# Patient Record
Sex: Female | Born: 1955 | Race: Black or African American | Hispanic: No | Marital: Married | Smoking: Never smoker
Health system: Southern US, Community
[De-identification: ages and names within clinical notes are randomized; demographics above are authoritative.]

## PROBLEM LIST (undated history)

## (undated) DIAGNOSIS — I10 Essential (primary) hypertension: Secondary | ICD-10-CM

## (undated) DIAGNOSIS — E7800 Pure hypercholesterolemia, unspecified: Secondary | ICD-10-CM

## (undated) HISTORY — PX: MULTIPLE TOOTH EXTRACTIONS: SHX2053

---
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
## 1996-07-22 ENCOUNTER — Encounter (INDEPENDENT_AMBULATORY_CARE_PROVIDER_SITE_OTHER): Payer: Self-pay | Admitting: *Deleted

## 1996-07-22 LAB — CONVERTED CEMR LAB

## 1999-09-10 ENCOUNTER — Emergency Department (HOSPITAL_COMMUNITY): Admission: EM | Admit: 1999-09-10 | Discharge: 1999-09-10 | Payer: Self-pay | Admitting: *Deleted

## 1999-09-10 ENCOUNTER — Encounter: Payer: Self-pay | Admitting: *Deleted

## 1999-12-26 ENCOUNTER — Emergency Department (HOSPITAL_COMMUNITY): Admission: EM | Admit: 1999-12-26 | Discharge: 1999-12-27 | Payer: Self-pay | Admitting: Emergency Medicine

## 2000-04-26 ENCOUNTER — Emergency Department (HOSPITAL_COMMUNITY): Admission: EM | Admit: 2000-04-26 | Discharge: 2000-04-26 | Payer: Self-pay | Admitting: Emergency Medicine

## 2000-04-26 ENCOUNTER — Encounter: Payer: Self-pay | Admitting: Emergency Medicine

## 2000-12-03 ENCOUNTER — Emergency Department (HOSPITAL_COMMUNITY): Admission: EM | Admit: 2000-12-03 | Discharge: 2000-12-03 | Payer: Self-pay | Admitting: Emergency Medicine

## 2000-12-05 ENCOUNTER — Emergency Department (HOSPITAL_COMMUNITY): Admission: EM | Admit: 2000-12-05 | Discharge: 2000-12-05 | Payer: Self-pay | Admitting: Emergency Medicine

## 2000-12-12 ENCOUNTER — Encounter: Admission: RE | Admit: 2000-12-12 | Discharge: 2000-12-12 | Payer: Self-pay | Admitting: Family Medicine

## 2001-04-02 ENCOUNTER — Encounter: Admission: RE | Admit: 2001-04-02 | Discharge: 2001-04-02 | Payer: Self-pay | Admitting: Family Medicine

## 2001-04-13 ENCOUNTER — Encounter: Admission: RE | Admit: 2001-04-13 | Discharge: 2001-04-13 | Payer: Self-pay | Admitting: Family Medicine

## 2001-04-18 ENCOUNTER — Ambulatory Visit (HOSPITAL_COMMUNITY): Admission: RE | Admit: 2001-04-18 | Discharge: 2001-04-18 | Payer: Self-pay | Admitting: *Deleted

## 2001-05-04 ENCOUNTER — Encounter: Admission: RE | Admit: 2001-05-04 | Discharge: 2001-05-04 | Payer: Self-pay | Admitting: Family Medicine

## 2001-06-09 ENCOUNTER — Encounter: Admission: RE | Admit: 2001-06-09 | Discharge: 2001-06-09 | Payer: Self-pay | Admitting: Family Medicine

## 2001-06-17 ENCOUNTER — Encounter: Admission: RE | Admit: 2001-06-17 | Discharge: 2001-06-17 | Payer: Self-pay | Admitting: Family Medicine

## 2002-02-07 ENCOUNTER — Emergency Department (HOSPITAL_COMMUNITY): Admission: EM | Admit: 2002-02-07 | Discharge: 2002-02-07 | Payer: Self-pay | Admitting: *Deleted

## 2004-01-04 ENCOUNTER — Emergency Department (HOSPITAL_COMMUNITY): Admission: EM | Admit: 2004-01-04 | Discharge: 2004-01-05 | Payer: Self-pay | Admitting: Emergency Medicine

## 2005-08-06 ENCOUNTER — Ambulatory Visit: Payer: Self-pay | Admitting: Family Medicine

## 2005-08-30 ENCOUNTER — Ambulatory Visit: Payer: Self-pay | Admitting: Family Medicine

## 2005-08-30 LAB — CONVERTED CEMR LAB
HDL: 58 mg/dL
HDL: 58 mg/dL
HDL: 58 mg/dL
HDL: 58 mg/dL
LDL Cholesterol: 122 mg/dL
LDL Cholesterol: 122 mg/dL
LDL Cholesterol: 122 mg/dL
LDL Cholesterol: 122 mg/dL
Pap Smear: NORMAL
Pap Smear: NORMAL
Pap Smear: NORMAL
Pap Smear: NORMAL
Pap Smear: NORMAL

## 2006-09-19 ENCOUNTER — Encounter (INDEPENDENT_AMBULATORY_CARE_PROVIDER_SITE_OTHER): Payer: Self-pay | Admitting: *Deleted

## 2007-03-16 ENCOUNTER — Encounter: Payer: Self-pay | Admitting: *Deleted

## 2007-10-21 ENCOUNTER — Encounter: Payer: Self-pay | Admitting: *Deleted

## 2008-02-05 ENCOUNTER — Telehealth: Payer: Self-pay | Admitting: *Deleted

## 2008-02-24 ENCOUNTER — Ambulatory Visit: Payer: Self-pay | Admitting: Family Medicine

## 2008-02-24 ENCOUNTER — Encounter: Payer: Self-pay | Admitting: Family Medicine

## 2008-02-24 DIAGNOSIS — E785 Hyperlipidemia, unspecified: Secondary | ICD-10-CM | POA: Insufficient documentation

## 2008-02-24 DIAGNOSIS — E669 Obesity, unspecified: Secondary | ICD-10-CM | POA: Insufficient documentation

## 2008-02-24 DIAGNOSIS — J309 Allergic rhinitis, unspecified: Secondary | ICD-10-CM | POA: Insufficient documentation

## 2008-02-24 LAB — CONVERTED CEMR LAB
Cholesterol, target level: 200 mg/dL
Cholesterol: 186 mg/dL (ref 0–200)
HDL goal, serum: 40 mg/dL
HDL: 63 mg/dL (ref >39)
LDL Cholesterol: 98 mg/dL (ref 0–99)
LDL Goal: 160 mg/dL
Total CHOL/HDL Ratio: 3
Triglycerides: 126 mg/dL (ref <150)
VLDL: 25 mg/dL (ref 0–40)
Whiff Test: POSITIVE

## 2008-02-25 ENCOUNTER — Telehealth: Payer: Self-pay | Admitting: Family Medicine

## 2008-03-04 ENCOUNTER — Encounter: Payer: Self-pay | Admitting: Family Medicine

## 2008-03-22 ENCOUNTER — Encounter: Payer: Self-pay | Admitting: Family Medicine

## 2008-08-08 ENCOUNTER — Ambulatory Visit: Payer: Self-pay | Admitting: Family Medicine

## 2008-09-08 ENCOUNTER — Telehealth: Payer: Self-pay | Admitting: Sports Medicine

## 2008-09-12 ENCOUNTER — Ambulatory Visit: Payer: Self-pay | Admitting: Family Medicine

## 2009-07-26 ENCOUNTER — Ambulatory Visit: Payer: Self-pay | Admitting: Family Medicine

## 2009-07-27 ENCOUNTER — Encounter: Payer: Self-pay | Admitting: Family Medicine

## 2009-07-27 ENCOUNTER — Ambulatory Visit: Payer: Self-pay | Admitting: Family Medicine

## 2009-07-27 LAB — CONVERTED CEMR LAB
BUN: 11 mg/dL (ref 6–23)
Calcium: 8.6 mg/dL (ref 8.4–10.5)
Creatinine, Ser: 0.63 mg/dL (ref 0.40–1.20)
Glucose, Bld: 91 mg/dL (ref 70–99)

## 2009-07-31 ENCOUNTER — Encounter: Payer: Self-pay | Admitting: Family Medicine

## 2009-09-20 ENCOUNTER — Ambulatory Visit: Payer: Self-pay | Admitting: Family Medicine

## 2009-09-20 DIAGNOSIS — I10 Essential (primary) hypertension: Secondary | ICD-10-CM

## 2009-09-20 DIAGNOSIS — J069 Acute upper respiratory infection, unspecified: Secondary | ICD-10-CM | POA: Insufficient documentation

## 2009-09-20 HISTORY — DX: Essential (primary) hypertension: I10

## 2009-10-26 ENCOUNTER — Ambulatory Visit: Payer: Self-pay

## 2010-04-23 ENCOUNTER — Encounter: Payer: Self-pay | Admitting: Family Medicine

## 2010-04-23 DIAGNOSIS — J45909 Unspecified asthma, uncomplicated: Secondary | ICD-10-CM | POA: Insufficient documentation

## 2010-08-23 NOTE — Miscellaneous (Signed)
  Clinical Lists Changes  Problems: Changed problem from ASTHMA (ICD-493.90) to ASTHMA, INTERMITTENT (ICD-493.90) 

## 2010-08-23 NOTE — Assessment & Plan Note (Signed)
Summary: headache,df   Vital Signs:  Patient profile:   55 year old female Weight:      204 pounds Temp:     97.5 degrees F oral Pulse rate:   77 / minute BP sitting:   127 / 82  (left arm) Cuff size:   regular  Vitals Entered By: Tessie Fass, [name] CC: headache x 1week Is Patient Diabetic? No Pain Assessment Patient in pain? yes     Location: head Intensity: 6   Primary Care Provider:  Milinda Antis MD  CC:  headache x 1week.  History of Present Illness: Alexandra White comes in for several issues: 1) No menses since October: Had been having menses fairly regularly but has not had one since October.  Though thinks she is experience some premenstrual symptoms now.  Wonders if she is starting menopause. 2) Headache - very mild, steady pressure on left side.  Feels like headache she will usually get before menses but has lasted a week.  No associated weakenss, numbness, visual changes.  Self describes as very mild.  Took one aspirin but it didn't resolve.   3) Numbness in feet - had tingling in both feet for about an hour a few days ago.  Sister recently had a stroke and this worried her.  There is a lot of diabetes in her family and she is worried she may have diabetes.   Habits & Providers  Alcohol-Tobacco-Diet     Tobacco Status: never  Allergies: 1)  ! Penicillin  Physical Exam  General:  overweight, alert, NAD, cooperative vitals reviewed.  Eyes:  pupils equal, pupils round, pupils reactive to light, pupils react to accomodation, corneas and lenses clear, and no injection.   Ears:  External ear exam shows no significant lesions or deformities.  Otoscopic examination reveals clear canals, tympanic membranes are intact bilaterally without bulging, retraction, inflammation or discharge. Hearing is grossly normal bilaterally. Mouth:  Oral mucosa and oropharynx without lesions or exudates.  Teeth in good repair. Lungs:  Normal respiratory effort, chest expands symmetrically.  Lungs are clear to auscultation, no crackles or wheezes. Heart:  Normal rate and regular rhythm. S1 and S2 normal without gallop, murmur, click, rub or other extra sounds. Pulses:  2+ radial and dp pulses Neurologic:  alert & oriented X3, cranial nerves II-XII intact, strength normal in all extremities, sensation intact to light touch, and gait normal.   Skin:  Intact without suspicious lesions or rashes Cervical Nodes:  No lymphadenopathy noted Psych:  Oriented X3, memory intact for recent and remote, normally interactive, good eye contact, not anxious appearing, and not depressed appearing.     Impression & Recommendations:  Problem # 1:  AMENORRHEA (ICD-626.0) Assessment New  Patietn is the age to begin menopause.  Denies possibility of pregnancy.  Offered preg test but patient declines.  Advised she coudl take one at home if she changes her mind.  However, feel likely due to initiation of menopause.  Discussed menses often space and become irregular so she may still have occassional menses.   Orders: [facility]- Est  Level 4 ([address])  Problem # 2:  HEADACHE (ICD-784.0) Assessment: New  Very mild.  No red flags.  No indication for imaging at  this time.  REcommended ibuprofen and tylenol.   Orders: [facility]- Est  Level 4 ([address])  Problem # 3:  NUMBNESS (ICD-782.0) Assessment: New  Unclear etiology.  Brief..  No recurrence.  Given family history and overweight, will have her return for fasting BMET  to check sugar and electrolytes.   Orders: [facility]- Est  Level 4 ([address]214)Future Orders: Basic Met-[facility] ([address]81-19147) ... 08/18/2009  Complete Medication List: 1)  Claritin-d 24 Hour 10-240 Mg Xr24h-tab (Loratadine-pseudoephedrine)

## 2010-08-23 NOTE — Assessment & Plan Note (Signed)
Summary: F/U VISIT/[facility]    This blood pressure is much improved off of the OTC medications for her virus. No other changes needed at this time, I am in agreement with the exercise and fluid intake. Milinda Antis MD  October 27, 2009 5:28 PM    Nurse Visit Presented for BP check, stated that she got lighthead after excercising yesterday, states she did not eat or have alot of fluid intake before or during excercise class, stated that once she was home she did drink fluids per a freinds advice and felt better.  Advised about fluid intake before during and after exercising, explained effects of dehydration to the body, voiced understanding.  Told her that I would forward note to Dr about BP and if she had any other advise that we would call her.  She has been off all the OTC medication except Claritin for 1 to 1 1/2 weeks To PCP.Marland KitchenGladstone Pih  October 26, 2009 11:44 AM   Vital Signs:  Patient profile:   55 year old female Pulse rate:   81 / minute BP sitting:   128 / 85  (left arm) Cuff size:   regular  Vitals Entered By: Gladstone Pih (October 26, 2009 11:35 AM)  Allergies: 1)  ! Penicillin  Orders Added: 1)  Est Level 1- [facility] [40981]

## 2010-08-23 NOTE — Assessment & Plan Note (Signed)
Summary: hoarness/congestion,df   Vital Signs:  Patient profile:   55 year old female Weight:      98.6 pounds Temp:     98.6 degrees F oral Pulse rate:   79 / minute BP sitting:   141 / 96  (left arm) Cuff size:   large  Vitals Entered By: Tessie Fass [name] (September 20, 2009 1:37 PM) CC: cough and congestion x 1 weeks Is Patient Diabetic? No Pain Assessment Patient in pain? no        Primary Care Provider:  Milinda Antis MD  CC:  cough and congestion x 1 weeks.  History of Present Illness:    Pt states she has had  cold symptoms x 1 week. subjective fever initially, with sinus drainage and pressure.+ productive cough yellow sputum but improving. More concerned with continued cough and hoarse voice. + sick contacts with people at work. No jaw pain Take OTC nyquil, cough meds, robitussin   ROS- denies chest pain, SOB, muscle aches, diarrhea, dysuria, occasional chills   Habits & Providers  Alcohol-Tobacco-Diet     Tobacco Status: never  Current Medications (verified): 1)  Claritin-D 24 Hour 10-240 Mg  Xr24h-Tab (Loratadine-Pseudoephedrine) 2)  Tessalon Perles 100 Mg Caps (Benzonatate) .Marland Kitchen.. 1 By Mouth Three Times A Day As Needed Cough 3)  Flonase 50 Mcg/act Susp (Fluticasone Propionate) .Marland Kitchen.. 1 Spray Each Nostril Twice  A Day X 5 Days  Allergies (verified): 1)  ! Penicillin  Past History:  Past Medical History: Last updated: 02/24/2008 Childhood Asthma Allergic rhinitis  Review of Systems       Per HPI  Physical Exam  General:  overweight, alert, NAD, cooperative vitals reviewed.  Voice hoarse Eyes:  No corneal or conjunctival inflammation noted. EOMI. Perrla.  Ears:  External ear exam shows no significant lesions or deformities.  Otoscopic examination reveals clear canals, tympanic membranes are intact bilaterally without bulging, retraction, inflammation or discharge. Hearing is grossly normal bilaterally. Nose:  clear rhinorrhea, mild TTP over  maxiallary/ethmoid sinus  Mouth:  MMM, no pharygeal erythema, no exudates Neck:  shotty LAD No wheeze in upper airway/neck Lungs:  CTAB Heart:  RRR   Impression & Recommendations:  Problem # 1:  VIRAL URI (ICD-465.9) Assessment New  Symptoms more consistent with viral URI, no likley bacterial sinusitis as too early. Treat symptomatically. Note history of allergic rhinitis, trial of flonase as well.  Her updated medication list for this problem includes:    Claritin-d 24 Hour 10-240 Mg Xr24h-tab (Loratadine-pseudoephedrine)    Tessalon Perles 100 Mg Caps (Benzonatate) .Marland Kitchen... 1 by mouth three times a day as needed cough  Orders: [facility]- Est  Level 4 (11914)  Problem # 2:  ALLERGIC RHINITIS (ICD-477.9) Assessment: Deteriorated  Her updated medication list for this problem includes:    Flonase 50 Mcg/act Susp (Fluticasone propionate) .Marland Kitchen... 1 spray each nostril twice  a day x 5 days  Orders: [facility]- Est  Level 4 ([address]214)  Problem # 3:  ELEVATED BP READING WITHOUT DX HYPERTENSION (ICD-796.2) Assessment: New  Recheck after virus, pt has used many OTC meds that may be driving BP up.   Orders: [facility]- Est  Level 4 (99214)  Complete Medication List: 1)  Claritin-d 24 Hour 10-240 Mg Xr24h-tab (Loratadine-pseudoephedrine) 2)  Tessalon Perles 100 Mg Caps (Benzonatate) .Marland Kitchen.. 1 by mouth three times a day as needed cough 3)  Flonase 50 Mcg/act Susp (Fluticasone propionate) .Marland Kitchen.. 1 spray each nostril twice  a day x 5 days  Patient Instructions:  1)  You have a virus for your  cold use the cough drops three times a day and the throat spray (Chloroseptic spray) 2)  Try hot tea with honey 3)  You can use the nasal spray for your allergy like symptoms 4)  continue the Claritin daily 5)  Follow up for repeat blood pressure with the nurse in 3 weeks Prescriptions: FLONASE 50 MCG/ACT SUSP (FLUTICASONE PROPIONATE) 1 spray each nostril twice  a day x 5 days  #1 x 0   Entered and Authorized by:   Milinda Antis MD   Signed by:   Milinda Antis MD on 09/20/2009   Method used:   Electronically to        [facility] [address] #[id number]* (retail)       [address]       [place], [place]  [address]       Ph: [phone]       Fax: [phone]   RxID:   [id number] TESSALON PERLES 100 MG CAPS (BENZONATATE) 1 by mouth three times a day as needed cough  #30 x 0   Entered and Authorized by:   Milinda Antis MD   Signed by:   Milinda Antis MD on 09/20/2009   Method used:   Electronically to        [facility] #[id number]* (retail)       [address]       [place], [place]  [address]       Ph: [phone]       Fax: [phone]   RxID:   [id number]

## 2010-08-23 NOTE — Letter (Signed)
Summary: Generic Letter  [facility]  [address]   [place], [place] [address]   Phone: [phone]  Fax: [phone]    07/31/2009  Alexandra White [address] [place], [place]  [address]  Dear [name],  The results of your recent labwork are below.  Everything is 100% normal.  There is no evidence of diabetes at this time.  If you have any questions, please contact our office.    Basic Metabolic Panel      Your value:       Normal range:   Order Note: FASTING   Sodium                    140 mEq/L                   135-145   Potassium                 4.2 mEq/L                   3.5-5.3   Chloride                  108 mEq/L                   96-112   CO2                       22 mEq/L                    19-32   Glucose                   91 mg/dL                    96-29   BUN                       11 mg/dL                    5-28   Creatinine                0.63 mg/dL                  0.40-1.20   Calcium                   8.6 mg/dL                   4.1-32.4        Sincerely,   Ardeen Garland  MD  Appended Document: Generic Letter mailed.

## 2010-08-23 NOTE — Progress Notes (Signed)
 Summary: Triage   Phone Note Call from Patient Call back at Home Phone [phone]   Summary of Call: pt is c/o ankle pain, is wanting xrays done, was seen in January for that. Initial call taken by: Benton Manson,  September 08, 2008 4:54 PM  Follow-up for Phone Call        c/o constant swelling & pain. has affected what shoes she can wear. will send message to pcp to see if she can have an order to go get an xray Follow-up by: Ginnie Mau RN,  September 08, 2008 4:59 PM  Additional Follow-up for Phone Call Additional follow up Details #1::        Not having seen the patient and with hx of trauma, I would only feel comfortable imaging ankle if [place] Criteria are met, she would need to be seen (maybe SDA/work-in) for this to be assessed, she can see me at my next available spot if Dr. Bari not available or set up SDA. Additional Follow-up by: Debby Petties MD,  September 09, 2008 11:22 AM      Appended Document: Triage pt wants appt early monday. pcp not available. to see S. Saxon

## 2010-10-05 ENCOUNTER — Ambulatory Visit: Payer: Self-pay | Admitting: Family Medicine

## 2010-10-08 ENCOUNTER — Ambulatory Visit: Payer: Self-pay | Admitting: Family Medicine

## 2010-12-20 ENCOUNTER — Encounter: Payer: Self-pay | Admitting: Family Medicine

## 2010-12-20 ENCOUNTER — Ambulatory Visit (INDEPENDENT_AMBULATORY_CARE_PROVIDER_SITE_OTHER): Payer: Self-pay | Admitting: Family Medicine

## 2010-12-20 DIAGNOSIS — S335XXA Sprain of ligaments of lumbar spine, initial encounter: Secondary | ICD-10-CM

## 2010-12-20 DIAGNOSIS — S39012A Strain of muscle, fascia and tendon of lower back, initial encounter: Secondary | ICD-10-CM | POA: Insufficient documentation

## 2010-12-20 MED ORDER — CYCLOBENZAPRINE HCL 10 MG PO TABS: 10.0000 mg | ORAL_TABLET | Freq: Every day | ORAL | Status: AC

## 2010-12-20 MED ORDER — IBUPROFEN 600 MG PO TABS: 600.0000 mg | ORAL_TABLET | Freq: Four times a day (QID) | ORAL | Status: AC | PRN

## 2010-12-20 NOTE — Progress Notes (Signed)
  Subjective:    Patient ID: Alexandra White, female    DOB: January 01, 1956, 55 y.o.   MRN: [id number]  HPI  Back Pain: Patient presents for presents evaluation of low back problems.  Symptoms have been present for 4 days and include stiffness in right side more than left.. Initial inciting event: she and her husband just finished moving.. Symptoms are worst: nighttime, she has not been able to sleep. Alleviating factors identifiable by patient are none.  She tried Advil, Midol, and hot tub but did not help. Exacerbating factors identifiable by patient are laying it at night makes it worse, walking makes it better.. Treatments so far initiated by patient: Advil, Midol, hot tub, heating pads. Previous lower back problems: none. Previous workup: none. Previous treatments: none. She denies Fever, chills, past cancer, currently immunosuppressed, urinary/bowel incontinence, numbness in legs, weakness in legs.   She denies saddle distribution numbness or pain, no pain radiating to her legs, no dysuria, frequency, hematuria, fever, chills, nausea, vomiting.   Review of Systems Negative except hpi    Objective:   Physical Exam  Constitutional: She appears well-developed and well-nourished. No distress.  Musculoskeletal:       Lumbar back: She exhibits pain and spasm. She exhibits normal range of motion, no tenderness, no bony tenderness, no swelling, no edema, no deformity, no laceration and normal pulse.       Sitting and lying straight leg raise: normal Hips stable Pulses: normal Reflexes: +2 Extension: mild tenderness Flexion: intact Gait:normal  Skin: Skin is warm. No rash noted. No erythema.          Assessment & Plan:

## 2010-12-20 NOTE — Patient Instructions (Signed)
Back Exercises Back exercises help treat and prevent back injuries. The goal of back exercises is to increase the strength of your abdominal and back muscles and the flexibility of your back. These exercises should be started when you no longer have back pain. Back exercises include: 1. Pelvic Tilt - Lie on your back with your knees bent. Tilt your pelvis until the lower part of your back is against the floor. Hold this position 5-10 sec and repeat 5-10 times.  2. Knee to Chest - Pull first one knee up against your chest and hold for 20-30 seconds, repeat this with the other knee, and then both knees. This may be done with the other leg straight or bent, whichever feels better.  3. Sit-Ups or Curl-Ups - Bend your knees 90 degrees. Start with tilting your pelvis, and do a partial, slow sit-up, lifting your trunk only 30-45 degrees off the floor. Take at least 2-3 sec for each sit-up. Do not do sit-ups with your knees out straight. If partial sit-ups are difficult, simply do the above but with only tightening your abdominal muscles and holding it as directed.  4. Hip-Lift - Lie on your back with your knees flexed 90 degrees. Push down with your feet and shoulders as you raise your hips a couple inches off the floor; hold for 10 sec, repeat 5-10 times.  5. Back arches - Lie on your stomach, propping yourself up on bent elbows. Slowly press on your hands, causing an arch in your low back. Repeat 3-5 times. Any initial stiffness and discomfort should lessen with repetition over time.  6. Shoulder-Lifts - Lie face down with arms beside your body. Keep hips and torso pressed to floor as you slowly lift your head and shoulders off the floor.  Do not overdo your exercises, especially in the beginning. Exercises may cause you some mild back discomfort which lasts for a few minutes; however, if the pain is more severe, or lasts for more than 15 minutes, do not continue exercises until you see your caregiver.  Improvement with exercise therapy for back problems is slow.  See your caregivers for assistance with developing a proper back exercise program. Document Released: 08/15/2004 Document Re-Released: 10/04/2008 Exit[employer] Patient Information 2011 [employer]. 

## 2010-12-21 NOTE — Assessment & Plan Note (Signed)
Low back strain likely from moving from her house a few days ago.  No red flags on ROS. Will treat with NSAID, flexeril and handout on back strengthening exercises.

## 2011-05-07 ENCOUNTER — Ambulatory Visit: Payer: Self-pay | Admitting: Family Medicine

## 2012-08-05 ENCOUNTER — Encounter: Payer: Self-pay | Admitting: Family Medicine

## 2012-09-08 ENCOUNTER — Ambulatory Visit (INDEPENDENT_AMBULATORY_CARE_PROVIDER_SITE_OTHER): Payer: Self-pay | Admitting: Family Medicine

## 2012-09-08 ENCOUNTER — Ambulatory Visit: Payer: Self-pay

## 2012-09-08 VITALS — BP 155/94 | HR 76 | Temp 98.2°F | Ht 67.0 in | Wt 198.0 lb

## 2012-09-08 DIAGNOSIS — E669 Obesity, unspecified: Secondary | ICD-10-CM | POA: Insufficient documentation

## 2012-09-08 DIAGNOSIS — R42 Dizziness and giddiness: Secondary | ICD-10-CM | POA: Insufficient documentation

## 2012-09-08 DIAGNOSIS — R030 Elevated blood-pressure reading, without diagnosis of hypertension: Secondary | ICD-10-CM

## 2012-09-08 LAB — COMPREHENSIVE METABOLIC PANEL
ALT: 15 U/L (ref 0–35)
Alkaline Phosphatase: 72 U/L (ref 39–117)
Glucose, Bld: 97 mg/dL (ref 70–99)
Sodium: 141 mEq/L (ref 135–145)
Total Bilirubin: 0.5 mg/dL (ref 0.3–1.2)
Total Protein: 6.5 g/dL (ref 6.0–8.3)

## 2012-09-08 LAB — CBC
MCH: 27.9 pg (ref 26.0–34.0)
MCV: 83.9 fL (ref 78.0–100.0)
Platelets: 257 10*3/uL (ref 150–400)
RBC: 4.77 MIL/uL (ref 3.87–5.11)

## 2012-09-08 MED ORDER — MECLIZINE HCL 25 MG PO TABS: 25.0000 mg | ORAL_TABLET | Freq: Three times a day (TID) | ORAL | Status: DC | PRN

## 2012-09-08 NOTE — Assessment & Plan Note (Signed)
Unsure of cause of dizziness.  Will get baseline labwork.   Possibly due to inner ear issue, will give trial of meclizine. Her neurological exam is completely normal and no findings, will give her a trial of meclizine to see if this is helpful.  Instructed to follow up with PCP for repeat blood pressure and follow up of this.

## 2012-09-08 NOTE — Assessment & Plan Note (Signed)
BP elevated, will have her f/u with PCP for BP recheck and discuss whether she needs to be on medication.

## 2012-09-08 NOTE — Progress Notes (Signed)
  Subjective:    Patient ID: Alexandra White, female    DOB: [birth year], 57 y.o.   MRN: [id number]  HPI  1.  Dizziness:  C/o of dizziness x2 days.  States she began having dizziness after dinner yesterday.  She is concerned she may have high blood sugar because diabetes runs in her family.  Her dizziness has improved today to near resolution.  She finds if difficult to describe the dizziness as feeling if the room is spinning or light headedness. She has a history of borderline hypertension and has never been on any medication.  She does endorse polyuria without polydipsia.  She has had no difficulty with gait and denies any weakness, numbness or tingling.   Review of Systems Per HPI    Objective:   Physical Exam  Constitutional: She appears well-nourished. No distress.  HENT:  Head: Normocephalic and atraumatic.  Eyes: EOM are normal. Pupils are equal, round, and reactive to light.  Neck: Neck supple. No thyromegaly present.  Cardiovascular: Normal rate, regular rhythm and normal heart sounds.   Pulmonary/Chest: Effort normal and breath sounds normal. No respiratory distress. She has no wheezes.  Musculoskeletal: She exhibits no edema.  Neurological: She is alert. She has normal strength. No cranial nerve deficit or sensory deficit. She displays a negative Romberg sign. Coordination and gait normal.  Cerebellar testing normal.  Non focal neurological exam.           Assessment & Plan:

## 2012-09-08 NOTE — Patient Instructions (Addendum)
Thank you for coming in today, it was good to see you Your blood pressure is elevated today, please schedule a follow up as soon as you can to follow up with Dr. Louanne Belton I have sent in a medication that may be helpful for your dizziness, start it at night as it can make you drowsy i will let you know if your labwork is abnormal.

## 2012-09-24 ENCOUNTER — Telehealth: Payer: Self-pay | Admitting: Family Medicine

## 2012-09-24 NOTE — Telephone Encounter (Signed)
Patient informed labs normal.  Gaylene Brooks, RN

## 2012-09-24 NOTE — Telephone Encounter (Signed)
Patient calling to received labwork results. Please call [phone]

## 2012-09-27 ENCOUNTER — Encounter: Payer: Self-pay | Admitting: Family Medicine

## 2013-06-03 ENCOUNTER — Ambulatory Visit: Payer: Self-pay | Admitting: Family Medicine

## 2014-02-03 ENCOUNTER — Ambulatory Visit: Payer: Self-pay | Admitting: Family Medicine

## 2014-02-22 ENCOUNTER — Ambulatory Visit: Payer: Self-pay | Admitting: Family Medicine

## 2014-04-18 ENCOUNTER — Ambulatory Visit (INDEPENDENT_AMBULATORY_CARE_PROVIDER_SITE_OTHER): Payer: Managed Care, Other (non HMO) | Admitting: Family Medicine

## 2014-04-18 ENCOUNTER — Encounter: Payer: Self-pay | Admitting: Family Medicine

## 2014-04-18 VITALS — BP 152/91 | HR 81 | Temp 98.2°F | Ht 67.0 in | Wt 199.0 lb

## 2014-04-18 DIAGNOSIS — S76319A Strain of muscle, fascia and tendon of the posterior muscle group at thigh level, unspecified thigh, initial encounter: Secondary | ICD-10-CM | POA: Insufficient documentation

## 2014-04-18 DIAGNOSIS — IMO0002 Reserved for concepts with insufficient information to code with codable children: Secondary | ICD-10-CM

## 2014-04-18 DIAGNOSIS — S76311A Strain of muscle, fascia and tendon of the posterior muscle group at thigh level, right thigh, initial encounter: Secondary | ICD-10-CM

## 2014-04-18 MED ORDER — IBUPROFEN 800 MG PO TABS: 800.0000 mg | ORAL_TABLET | Freq: Three times a day (TID) | ORAL | Status: DC | PRN

## 2014-04-18 MED ORDER — CYCLOBENZAPRINE HCL 5 MG PO TABS: 5.0000 mg | ORAL_TABLET | Freq: Three times a day (TID) | ORAL | Status: DC | PRN

## 2014-04-18 NOTE — Progress Notes (Signed)
Alexandra White is a 58 y.o. female who presents today for R low back pain with occasional numbness into the lateral aspect of her foot.   This has been ongoing now for about 2 days.  She did move furniture around about 3 days ago now.  States she has R low back pain into the gluteal region with occasional paresthesias down the lateral aspect of her leg into the lateral foot.  Denies any weakness, bowel/bladder issues, fever, chills, night sweats, or weight loss.  She has had this previously that went away with time and muscle relaxers.  To this point, she has not tried anything to improve her pain and has not noticed anything that exacerbates this.    No past medical history on file.  History  Smoking status  . Passive Smoke Exposure - Never Smoker  Smokeless tobacco  . Not on file    No family history on file.  Current Outpatient Prescriptions on File Prior to Visit  Medication Sig Dispense Refill  . meclizine (ANTIVERT) 25 MG tablet Take 1 tablet (25 mg total) by mouth 3 (three) times daily as needed for dizziness.  30 tablet  0   No current facility-administered medications on file prior to visit.    ROS: Per HPI.  All other systems reviewed and are negative.   Physical Exam Filed Vitals:   04/18/14 1013  BP: 152/91  Pulse: 81  Temp: 98.2 F (36.8 C)    Physical Examination: General appearance - alert, well appearing, and in no distress Heart - normal rate, regular rhythm, normal S1, S2, no murmurs, rubs, clicks or gallops Musculoskeletal - R Hip Hip: ROM IR: 80 Deg, ER: 80 Deg, Flexion: 120 Deg, Extension: 100 Deg, Abduction: 45 Deg, Adduction: 45 Deg Strength IR: 5/5, ER: 5/5, Flexion: 5/5, Extension: 5/5, Abduction: 5/5, Adduction: 5/5 Pelvic alignment unremarkable to inspection and palpation. Standing hip rotation and gait without trendelenburg / unsteadiness. + tenderness over piriformis and greater trochanter. No SI joint tenderness and normal minimal SI  movement.  Back: No gross edema/erythema/deformity.  No TTP in the lumbar region.  Full ROM in flexion/extension.  MS 5/5 in all planes.  Negative SLR both seated and supine.  Neurovascularly intact B/L

## 2014-04-18 NOTE — Assessment & Plan Note (Addendum)
Hx and Physical C/w piriformis muscle strain as she is tender on the posterior Greater Tubercle, pain worse with cross over maneuver, and having occasional paresthesias (S1, sural nerve) when doing this.  No weakness or bowel/bladder, sensation changes, or red flags.  Will tx with ibuprofen 800 mg TID-QID, flexeril 5 mg TID PRN and QHS, and stretches.  F/U in 10-14 days if no improvement and can consider low back imaging, formal physical therapy, or injection into the area.

## 2014-04-18 NOTE — Patient Instructions (Signed)
Piriformis Syndrome with Rehab Piriformis syndrome is a condition the affects the nervous system in the area of the hip, and is characterized by pain and possibly a loss of feeling in the backside (posterior) thigh that may extend down the entire length of the leg. The symptoms are caused by an increase in pressure on the sciatic nerve by the piriformis muscle, which is on the back of the hip and is responsible for externally rotating the hip. The sciatic nerve and its branches connect to much of the leg. Normally the sciatic nerve runs between the piriformis muscle and other muscles. However, in certain individuals the nerve runs through the muscle, which causes an increase in pressure on the nerve and results in the symptoms of piriformis syndrome. SYMPTOMS   Pain, tingling, numbness, or burning in the back of the thigh that may also extend down the entire leg.  Occasionally, tenderness in the buttock.  Loss of function of the leg.  Pain that worsens when using the piriformis muscle (running, jumping, or stairs).  Pain that increases with prolonged sitting.  Pain that is lessened by lying flat on the back. CAUSES   Piriformis syndrome is the result of an increase in pressure placed on the sciatic nerve. Oftentimes, piriformis syndrome is an overuse injury.  Stress placed on the nerve from a sudden increase in the intensity, frequency, or duration of training.  Compensation of other extremity injuries. RISK INCREASES WITH:  Sports that involve the piriformis muscle (running, walking, or jumping).  You are born with (congenital) a defect in which the sciatic nerve passes through the muscle. PREVENTION  Warm up and stretch properly before activity.  Allow for adequate recovery between workouts.  Maintain physical fitness:  Strength, flexibility, and endurance.  Cardiovascular fitness. PROGNOSIS  If treated properly, the symptoms of piriformis syndrome usually resolve in 2 to 6  weeks. RELATED COMPLICATIONS   Persistent and possibly permanent pain and numbness in the lower extremity.  Weakness of the extremity that may progress to disability and inability to compete. TREATMENT  The most effective treatment for piriformis syndrome is rest from any activities that aggravate the symptoms. Ice and pain medication may help reduce pain and inflammation. The use of strengthening and stretching exercises may help reduce pain with activity. These exercises may be performed at home or with a therapist. A referral to a therapist may be given for further evaluation and treatment, such as ultrasound. Corticosteroid injections may be given to reduce inflammation that is causing pressure to be placed on the sciatic nerve. If nonsurgical (conservative) treatment is unsuccessful, then surgery may be recommended.  MEDICATION   If pain medication is necessary, then nonsteroidal anti-inflammatory medications, such as aspirin and ibuprofen, or other minor pain relievers, such as acetaminophen, are often recommended.  Do not take pain medication for 7 days before surgery.  Prescription pain relievers may be given if deemed necessary by your caregiver. Use only as directed and only as much as you need.  Corticosteroid injections may be given by your caregiver. These injections should be reserved for the most serious cases, because they may only be given a certain number of times. HEAT AND COLD:   Cold treatment (icing) relieves pain and reduces inflammation. Cold treatment should be applied for 10 to 15 minutes every 2 to 3 hours for inflammation and pain and immediately after any activity that aggravates your symptoms. Use ice packs or massage the area with a piece of ice (ice massage).  Heat   treatment may be used prior to performing the stretching and strengthening activities prescribed by your caregiver, physical therapist, or athletic trainer. Use a heat pack or soak the injury in warm  water. SEEK IMMEDIATE MEDICAL CARE IF:  Treatment seems to offer no benefit, or the condition worsens.  Any medications produce adverse side effects. EXERCISES RANGE OF MOTION (ROM) AND STRETCHING EXERCISES - Piriformis Syndrome These exercises may help you when beginning to rehabilitate your injury. Your symptoms may resolve with or without further involvement from your physician, physical therapist, or athletic trainer. While completing these exercises, remember:   Restoring tissue flexibility helps normal motion to return to the joints. This allows healthier, less painful movement and activity.  An effective stretch should be held for at least 30 seconds.  A stretch should never be painful. You should only feel a gentle lengthening or release in the stretched tissue. STRETCH - Hip Rotators  Lie on your back on a firm surface. Grasp your right / left knee with your right / left hand and your ankle with your opposite hand.  Keeping your hips and shoulders firmly planted, gently pull your right / left knee and rotate your lower leg toward your opposite shoulder until you feel a stretch in your buttocks.  Hold this stretch for __________ seconds. Repeat this stretch __________ times. Complete this stretch __________ times per day. STRETCH - Iliotibial Band  On the floor or bed, lie on your side so your right / left leg is on top. Bend your knee and grab your ankle.  Slowly bring your knee back so that your thigh is in line with your trunk. Keep your heel at your buttocks and gently arch your back so your head, shoulders, and hips line up.  Slowly lower your leg so that your knee approaches the floor/bed until you feel a gentle stretch on the outside of your right / left thigh. If you do not feel a stretch and your knee will not fall farther, place the heel of your opposite foot on top of your knee and pull your thigh down farther.  Hold this stretch for __________ seconds. Repeat  __________ times. Complete __________ times per day. STRENGTHENING EXERCISES - Piriformis Syndrome  These are some of the caregiver again or until your symptoms are resolved. Remember:   Strong muscles with good endurance tolerate stress better.  Do the exercises as initially prescribed by your caregiver. Progress slowly with each exercise, gradually increasing the number of repetitions and weight used under their guidance. STRENGTH - Hip Abductors, Straight Leg Raises Be aware of your form throughout the entire exercise so that you exercise the correct muscles. Sloppy form means that you are not strengthening the correct muscles.  Lie on your side so that your head, shoulders, knee, and hip line up. You may bend your lower knee to help maintain your balance. Your right / left leg should be on top.  Roll your hips slightly forward, so that your hips are stacked directly over each other and your right / left knee is facing forward.  Lift your top leg up 4-6 inches, leading with your heel. Be sure that your foot does not drift forward or that your knee does not roll toward the ceiling.  Hold this position for __________ seconds. You should feel the muscles in your outer hip lifting (you may not notice this until your leg begins to tire).  Slowly lower your leg to the starting position. Allow the muscles to fully  relax before beginning the next repetition. Repeat __________ times. Complete this exercise __________ times per day.  STRENGTH - Hip Abductors, Quadruped  On a firm, lightly padded surface, position yourself on your hands and knees. Your hands should be directly below your shoulders and your knees should be directly below your hips.  Keeping your right / left knee bent, lift your leg out to the side. Keep your legs level and in line with your shoulders.  Position yourself on your hands and knees.  Hold for __________ seconds.  Keeping your trunk steady and your hips level, slowly  lower your leg to the starting position. Repeat __________ times. Complete this exercise __________ times per day.  STRENGTH - Hip Abductors, Standing  Tie one end of a rubber exercise band/tubing to a secure surface (table, pole) and tie a loop at the other end.  Place the loop around your right / left ankle. Keeping your ankle with the band directly opposite of the secured end, step away until there is tension in the tube/band.  Hold onto a chair as needed for balance.  Keeping your back upright, your shoulders over your hips, and your toes pointing forward, lift your right / left leg out to your side. Be sure to lift your leg with your hip muscles. Do not "throw" your leg or tip your body to lift your leg.  Slowly and with control, return to the starting position. Repeat exercise __________ times. Complete this exercise __________ times per day.  Document Released: 07/08/2005 Document Revised: 11/22/2013 Document Reviewed: 10/20/2008 ExitCare Patient Information 2015 [employer]. This information is not intended to replace advice given to you by your health care provider. Make sure you discuss any questions you have with your health care provider.  

## 2014-08-22 ENCOUNTER — Ambulatory Visit: Payer: Managed Care, Other (non HMO) | Admitting: Family Medicine

## 2014-09-09 ENCOUNTER — Ambulatory Visit: Payer: Managed Care, Other (non HMO) | Admitting: Family Medicine

## 2014-12-16 ENCOUNTER — Ambulatory Visit: Payer: Managed Care, Other (non HMO) | Admitting: Family Medicine

## 2015-01-03 ENCOUNTER — Encounter: Payer: Managed Care, Other (non HMO) | Admitting: Family Medicine

## 2015-02-20 ENCOUNTER — Ambulatory Visit: Payer: Managed Care, Other (non HMO) | Admitting: Family Medicine

## 2015-03-01 ENCOUNTER — Encounter: Payer: Managed Care, Other (non HMO) | Admitting: Family Medicine

## 2015-03-16 ENCOUNTER — Ambulatory Visit (INDEPENDENT_AMBULATORY_CARE_PROVIDER_SITE_OTHER): Payer: Managed Care, Other (non HMO) | Admitting: Family Medicine

## 2015-03-16 VITALS — BP 170/120 | HR 80 | Temp 99.4°F | Wt 199.9 lb

## 2015-03-16 DIAGNOSIS — R030 Elevated blood-pressure reading, without diagnosis of hypertension: Secondary | ICD-10-CM | POA: Diagnosis not present

## 2015-03-16 DIAGNOSIS — I10 Essential (primary) hypertension: Secondary | ICD-10-CM | POA: Diagnosis not present

## 2015-03-16 DIAGNOSIS — IMO0001 Reserved for inherently not codable concepts without codable children: Secondary | ICD-10-CM

## 2015-03-16 LAB — POCT UA - MICROSCOPIC ONLY

## 2015-03-16 LAB — POCT URINALYSIS DIPSTICK
BILIRUBIN UA: NEGATIVE
GLUCOSE UA: NEGATIVE
Ketones, UA: NEGATIVE
NITRITE UA: NEGATIVE
Protein, UA: NEGATIVE
Spec Grav, UA: 1.02
Urobilinogen, UA: 0.2
pH, UA: 6.5

## 2015-03-16 MED ORDER — AMLODIPINE BESYLATE 2.5 MG PO TABS: 2.5000 mg | ORAL_TABLET | Freq: Every day | ORAL | Status: DC

## 2015-03-16 NOTE — Patient Instructions (Addendum)
Thank you for coming in,   I will call you or send a letter wit the results of your labs.   Make an appointment in two weeks if you are still feeling bad.   I will send in a medication called amlodipine to help with your blood pressure.   Please bring all of your medications with you to each visit.    Please feel free to call with any questions or concerns at any time, at [phone]. --Dr. Raeford Razor

## 2015-03-16 NOTE — Progress Notes (Signed)
   Subjective:    Alexandra White - 59 y.o. female MRN [id number]  Date of birth: [birth year]  HPI  Hansika Leaming is here for elevated BP.   BP: no hx of HTN but has had elevated BP.  She has been having HA off and on.  She had her bp taken at work and it was elevated. 138/102 and 148/106 She has a fhx of HTN and Dm2.  She doesn't use tobacco, EtOH or illicit drugs  She has been using Claritin D on Sunday and Monday .  Health Maintenance:  Health Maintenance Due  Topic Date Due  . Hepatitis C Screening  [birth year]  . HIV Screening  04/25/1972  . TETANUS/TDAP  04/25/1976  . MAMMOGRAM  04/26/2007  . COLONOSCOPY  04/26/2007  . INFLUENZA VACCINE  02/20/2015    -  reports that she has been passively smoking.  She does not have any smokeless tobacco history on file. - Review of Systems: Per HPI.  - Past Medical History: Patient Active Problem List   Diagnosis Date Noted  . Strain of piriformis muscle 04/18/2014  . Obesity 09/08/2012  . ASTHMA, INTERMITTENT 04/23/2010  . ELEVATED BP READING WITHOUT DX HYPERTENSION 09/20/2009  . HYPERLIPIDEMIA, BORDERLINE 02/24/2008   - Medications: reviewed and updated Current Outpatient Prescriptions  Medication Sig Dispense Refill  . cyclobenzaprine (FLEXERIL) 5 MG tablet Take 1 tablet (5 mg total) by mouth 3 (three) times daily as needed for muscle spasms. 30 tablet 1  . ibuprofen (ADVIL,MOTRIN) 800 MG tablet Take 1 tablet (800 mg total) by mouth every 8 (eight) hours as needed. 30 tablet 0  . meclizine (ANTIVERT) 25 MG tablet Take 1 tablet (25 mg total) by mouth 3 (three) times daily as needed for dizziness. 30 tablet 0   No current facility-administered medications for this visit.     Review of Systems See HPI     Objective:   Physical Exam BP 170/120 mmHg  Pulse 80  Temp(Src) 99.4 F (37.4 C) (Oral)  Wt 199 lb 14.4 oz (90.674 kg)  LMP 09/19/2010 Gen: NAD, alert, cooperative with exam, well-appearing CV: RRR, good S1/S2, no  murmur, no edema, capillary refill brisk  Resp: CTABL, no wheezes, non-labored  Re-check: 142/84     Assessment & Plan:

## 2015-03-18 NOTE — Assessment & Plan Note (Addendum)
Would consider this a dx of HTN with a couple readings outside the office  - encourage diet and exercise (DASH) - start amlodipine  - BMP, Lipid panel, UA, and TSH

## 2015-03-21 ENCOUNTER — Encounter: Payer: Self-pay | Admitting: Family Medicine

## 2015-03-21 LAB — BASIC METABOLIC PANEL WITH GFR

## 2015-03-21 LAB — TSH

## 2015-03-24 ENCOUNTER — Telehealth: Payer: Self-pay

## 2015-03-24 ENCOUNTER — Telehealth: Payer: Self-pay | Admitting: Family Medicine

## 2015-03-24 ENCOUNTER — Encounter: Payer: Self-pay | Admitting: Family Medicine

## 2015-03-24 NOTE — Telephone Encounter (Signed)
Spoke with pt regarding previous note- Per Dr. Raeford Razor, ok to take tylenol with BP meds, but if HA continues to call for appointment.  Wallace Cullens, [name]

## 2015-03-24 NOTE — Telephone Encounter (Signed)
Pt called because she is taking a new BP medication but she is still having a little of the headache. She would like to speak to the doctor and discuss what is the next step and can she take some OTC medication for her headache. jw

## 2015-03-24 NOTE — Telephone Encounter (Signed)
Try to leave voicemail but mailbox is full.  If she calls back please have her speak with a nurse/[name] and inform her that her urine showed showed no protein which is reassuring. She can take Tylenol for her headache but I would avoid doing this for a long time. If the headache continues over the course of 3 weeks with no alleviation then she should be seen.   If any questions then please take the best time and phone number to call and I will try to call her back.   Rosemarie Ax, MD PGY-3, [facility] 03/24/2015, 10:20 AM

## 2015-03-29 ENCOUNTER — Telehealth: Payer: Self-pay | Admitting: Family Medicine

## 2015-03-29 NOTE — Telephone Encounter (Signed)
Pt calling and would like to receive her results from recent blood work from 03/16/15. Thank you, Fonda Kinder, ASA

## 2015-03-30 NOTE — Telephone Encounter (Signed)
Pt called back.. She went to [facility] on thurs last week and had her blood drawn This was [address] Please advise

## 2015-03-30 NOTE — Telephone Encounter (Signed)
Left VM for patient. If she calls back please have her speak with a nurse/[name] and inform that her labs samples were collected but the courier handled them incorrectly so we were unable to get any results. I thought she was informed of this earlier and up to her if she wants to re-draw the labs.   If any questions then please take the best time and phone number to call and I will try to call her back.   Rosemarie Ax, MD PGY-3, [facility] 03/30/2015, 1:13 PM

## 2015-03-31 ENCOUNTER — Telehealth: Payer: Self-pay | Admitting: Family Medicine

## 2015-03-31 NOTE — Telephone Encounter (Signed)
Unable to leave a voicemail for patient.  If she calls back please have her speak with a nurse/[name] and inform that she would need to have those labs sent to the office as I cannot see the labs that she had done at a [facility].   If any questions then please take the best time and phone number to call and I will try to call her back.   Rosemarie Ax, MD PGY-3, [facility] 03/31/2015, 10:08 AM

## 2015-04-06 NOTE — Telephone Encounter (Signed)
Pt calling once more. She states that the date that she went to [place] was on 03/23/2015 and would like to know if she should still come

## 2015-04-14 ENCOUNTER — Other Ambulatory Visit: Payer: Self-pay | Admitting: *Deleted

## 2015-04-14 MED ORDER — AMLODIPINE BESYLATE 2.5 MG PO TABS: 2.5000 mg | ORAL_TABLET | Freq: Every day | ORAL | Status: DC

## 2015-04-18 NOTE — Telephone Encounter (Signed)
Was this handled? Last message was 9/15 and I'm receiving this 9/27. Looking through her chart, I do not see any new results for any recent labs.

## 2015-04-19 NOTE — Telephone Encounter (Signed)
Confirmed with Herbie Baltimore in [employer], he said she was good that she does not need anymore labs they have already been completed. Katharina Caper, April D, [place]

## 2015-04-20 NOTE — Telephone Encounter (Signed)
Pt would like to know these results, she states she has not been contacted with them yet and would like to know if they are "good or bad". Please follow up with the patient, she states "no one ever calls me back". Please call the patient. Alexandra White, ASA

## 2015-04-20 NOTE — Telephone Encounter (Signed)
Please inform patient that her TSH and BMP were never resulted because of lab error. It seems like this was handled at some point but I am unfamiliar with the patient and need for lab work so I will wait for Dr. Raeford Razor to return before placing any orders for labs.

## 2015-04-25 NOTE — Telephone Encounter (Signed)
Will forward to MD to review results and check to with robert for most recent results. Jazmin Hartsell,[name]

## 2015-05-09 ENCOUNTER — Ambulatory Visit: Payer: Managed Care, Other (non HMO) | Admitting: Family Medicine

## 2015-05-19 ENCOUNTER — Other Ambulatory Visit: Payer: Self-pay | Admitting: *Deleted

## 2015-05-22 MED ORDER — AMLODIPINE BESYLATE 2.5 MG PO TABS: 2.5000 mg | ORAL_TABLET | Freq: Every day | ORAL | Status: DC

## 2015-05-30 ENCOUNTER — Encounter: Payer: Self-pay | Admitting: Family Medicine

## 2015-08-23 ENCOUNTER — Ambulatory Visit (INDEPENDENT_AMBULATORY_CARE_PROVIDER_SITE_OTHER): Payer: Managed Care, Other (non HMO) | Admitting: Family Medicine

## 2015-08-23 ENCOUNTER — Encounter: Payer: Self-pay | Admitting: Family Medicine

## 2015-08-23 VITALS — BP 150/99 | HR 73 | Temp 97.9°F | Wt 214.8 lb

## 2015-08-23 DIAGNOSIS — Z1159 Encounter for screening for other viral diseases: Secondary | ICD-10-CM

## 2015-08-23 DIAGNOSIS — R42 Dizziness and giddiness: Secondary | ICD-10-CM | POA: Diagnosis not present

## 2015-08-23 DIAGNOSIS — E669 Obesity, unspecified: Secondary | ICD-10-CM | POA: Diagnosis not present

## 2015-08-23 DIAGNOSIS — Z114 Encounter for screening for human immunodeficiency virus [HIV]: Secondary | ICD-10-CM

## 2015-08-23 DIAGNOSIS — E785 Hyperlipidemia, unspecified: Secondary | ICD-10-CM

## 2015-08-23 DIAGNOSIS — Z23 Encounter for immunization: Secondary | ICD-10-CM

## 2015-08-23 DIAGNOSIS — I10 Essential (primary) hypertension: Secondary | ICD-10-CM

## 2015-08-23 DIAGNOSIS — Z1211 Encounter for screening for malignant neoplasm of colon: Secondary | ICD-10-CM

## 2015-08-23 DIAGNOSIS — Z0000 Encounter for general adult medical examination without abnormal findings: Secondary | ICD-10-CM | POA: Diagnosis not present

## 2015-08-23 LAB — CBC
HCT: 43.2 % (ref 36.0–46.0)
HEMOGLOBIN: 13.7 g/dL (ref 12.0–15.0)
MCH: 27.3 pg (ref 26.0–34.0)
MCHC: 31.7 g/dL (ref 30.0–36.0)
MCV: 86.2 fL (ref 78.0–100.0)
MPV: 10 fL (ref 8.6–12.4)
PLATELETS: 268 10*3/uL (ref 150–400)
RBC: 5.01 MIL/uL (ref 3.87–5.11)
RDW: 14.3 % (ref 11.5–15.5)
WBC: 4.3 10*3/uL (ref 4.0–10.5)

## 2015-08-23 LAB — BASIC METABOLIC PANEL WITH GFR
BUN: 14 mg/dL (ref 7–25)
CHLORIDE: 106 mmol/L (ref 98–110)
CO2: 26 mmol/L (ref 20–31)
CREATININE: 0.79 mg/dL (ref 0.50–1.05)
Calcium: 9.4 mg/dL (ref 8.6–10.4)
GFR, EST NON AFRICAN AMERICAN: 83 mL/min (ref >=60)
GFR, Est African American: >89 mL/min (ref >=60)
GLUCOSE: 101 mg/dL — AB (ref 65–99)
Potassium: 4.3 mmol/L (ref 3.5–5.3)
Sodium: 139 mmol/L (ref 135–146)

## 2015-08-23 LAB — LIPID PANEL
CHOL/HDL RATIO: 2.9 ratio (ref <=5.0)
CHOLESTEROL: 225 mg/dL — AB (ref 125–200)
HDL: 78 mg/dL (ref >=46)
LDL Cholesterol: 129 mg/dL (ref <130)
Triglycerides: 90 mg/dL (ref <150)
VLDL: 18 mg/dL (ref <30)

## 2015-08-23 NOTE — Assessment & Plan Note (Signed)
Uncontrolled but not taking medication  - amlodipine

## 2015-08-23 NOTE — Assessment & Plan Note (Signed)
Reports she has started a work out program and is motivated - doesn't want to be referred to a nutritionist yet.

## 2015-08-23 NOTE — Progress Notes (Signed)
c  Subjective:    Alexandra White - 60 y.o. female MRN [id number]  Date of birth: 1956-07-04  HPI  Alexandra White is here for phsyical.  Annual Gynecological Exam Wt Readings from Last 3 Encounters:  08/23/15 214 lb 12.8 oz (97.433 kg)  03/16/15 199 lb 14.4 oz (90.674 kg)  04/18/14 199 lb (90.266 kg)   Last period: 09/19/2010 Regular periods: no Heavy bleeding: no  Sexually active: yes Hx of STD: no Dyspareunia: No Hot flashes: No Vaginal discharge: no Dysuria:No   Last mammogram: 2008 Breast mass or concerns: No Last Pap: 2007 History of abnormal pap: No  FH of breast, uterine, ovarian, colon cancer: No  HTN Disease Monitoring: Home BP Monitoring none  Medications:amlodipine  Chest pain- no     Dyspnea- no Compliance-  Takes as PRN basis .  Lightheadedness- yes in the am.    Edema- no  Dizziness Only occuring in the AM and when she stands Doesn't occur other aspects of the day Lasts <10 minutes   Feels lightheaded   Obesity Has gained some weight recently  Walks a lot at work  Reports to not eating as well  She has just started a new plan on working out and seems motivated   HYPERLIPIDEMIA Symptoms Chest pain on exertion:  no   . Leg claudication:   no Medications: none Compliance- n/a  Right upper quadrant pain- no   Muscle aches- no     Component Value Date/Time   CHOL 186 02/24/2008 2120   TRIG 126 02/24/2008 2120   HDL 63 02/24/2008 2120   VLDL 25 02/24/2008 2120   CHOLHDL 3.0 Ratio 02/24/2008 2120      Health Maintenance:  - will schedule pap smear  - flu and tdap today  - referral to GI  Health Maintenance Due  Topic Date Due  . Hepatitis C Screening  [birth year]16[birth year]  . HIV Screening  04/25/1972  . TETANUS/TDAP  04/25/1976  . MAMMOGRAM  04/26/2007  . COLONOSCOPY  04/26/2007  . PAP SMEAR  08/30/2008  . INFLUENZA VACCINE  02/20/2015    -  reports that she has been passively smoking.  She does not have any smokeless tobacco history  on file. - Review of Systems: Per HPI. - Past Medical History: Patient Active Problem List   Diagnosis Date Noted  . Dizziness 08/23/2015  . Healthcare maintenance 08/23/2015  . Obesity 09/08/2012  . ASTHMA, INTERMITTENT 04/23/2010  . HTN (hypertension) 09/20/2009  . HLD (hyperlipidemia) 02/24/2008   - Medications: reviewed and updated Current Outpatient Prescriptions  Medication Sig Dispense Refill  . amLODipine (NORVASC) 2.5 MG tablet Take 1 tablet (2.5 mg total) by mouth daily. 30 tablet 2  . cyclobenzaprine (FLEXERIL) 5 MG tablet Take 1 tablet (5 mg total) by mouth 3 (three) times daily as needed for muscle spasms. 30 tablet 1  . ibuprofen (ADVIL,MOTRIN) 800 MG tablet Take 1 tablet (800 mg total) by mouth every 8 (eight) hours as needed. 30 tablet 0  . meclizine (ANTIVERT) 25 MG tablet Take 1 tablet (25 mg total) by mouth 3 (three) times daily as needed for dizziness. 30 tablet 0   No current facility-administered medications for this visit.     Review of Systems See HPI     Objective:   Physical Exam BP 150/99 mmHg  Pulse 73  Temp(Src) 97.9 F (36.6 C) (Oral)  Wt 214 lb 12.8 oz (97.433 kg)  LMP 09/19/2010 Gen: NAD, alert, cooperative with exam,  HEENT: NCAT, EOMI, clear  conjunctiva, oropharynx clear, supple neck CV: RRR, good S1/S2, no murmur, no edema,   Resp: CTABL, no wheezes, non-labored Abd: SNTND, BS present, no guarding or organomegaly Skin: no rashes, normal turgor  Neuro: no gross deficits.  Psych:  alert and oriented    Assessment & Plan:   HTN (hypertension) Uncontrolled but not taking medication  - amlodipine   Obesity Reports she has started a work out program and is motivated - doesn't want to be referred to a nutritionist yet.   HLD (hyperlipidemia) History of but not on medication  - lipid panel today   Dizziness Seems orthostatic in nature.  - advised to be careful and slow upon standing  - if still occurring after a couple of months  may need to consider neurovestivular rehab.   Healthcare maintenance Labs and vaccines today  - f/u in one year

## 2015-08-23 NOTE — Patient Instructions (Signed)
Thank you for coming in,   I will call or send a letter with the results from today.   Please schedule a PAP smear when you leave today.   I have referred you to gastroenterology for a screening colonoscopy.   Please get your mammogram performed.   Please bring all of your medications with you to each visit.   Keep up the good work with the exercise.   Sign up for My Chart to have easy access to your labs results, and communication with your Primary care physician   Please feel free to call with any questions or concerns at any time, at [phone]. --Dr. Raeford Razor  Diet Recommendations  Starchy (carb) foods include: Bread, rice, pasta, potatoes, corn, crackers, bagels, muffins, all baked goods.   Protein foods include: Meat, fish, poultry, eggs, dairy foods, and beans such as pinto and kidney beans (beans also provide carbohydrate).   1. Eat at least 3 meals and 1-2 snacks per day. Never go more than 4-5 hours while awake without eating.  2. Limit starchy foods to TWO per meal and ONE per snack. ONE portion of a starchy  food is equal to the following:   - ONE slice of bread (or its equivalent, such as half of a hamburger bun).   - 1/2 cup of a "scoopable" starchy food such as potatoes or rice.   - 1 OUNCE (28 grams) of starchy snack foods such as crackers or pretzels (look on label).   - 15 grams of carbohydrate as shown on food label.  3. Both lunch and dinner should include a protein food, a carb food, and vegetables.   - Obtain twice as many veg's as protein or carbohydrate foods for both lunch and dinner.   - Try to keep frozen veg's on hand for a quick vegetable serving.     - Fresh or frozen veg's are best.  4. Breakfast should always include protein.

## 2015-08-23 NOTE — Assessment & Plan Note (Signed)
History of but not on medication  - lipid panel today

## 2015-08-23 NOTE — Assessment & Plan Note (Signed)
Labs and vaccines today  - f/u in one year

## 2015-08-23 NOTE — Assessment & Plan Note (Signed)
Seems orthostatic in nature.  - advised to be careful and slow upon standing  - if still occurring after a couple of months may need to consider neurovestivular rehab.

## 2015-08-24 ENCOUNTER — Other Ambulatory Visit: Payer: Self-pay | Admitting: *Deleted

## 2015-08-24 LAB — HEPATITIS C ANTIBODY: HCV AB: NEGATIVE

## 2015-08-24 LAB — HIV ANTIBODY (ROUTINE TESTING W REFLEX): HIV: NONREACTIVE

## 2015-08-24 LAB — TSH: TSH: 1.086 u[IU]/mL (ref 0.350–4.500)

## 2015-08-25 ENCOUNTER — Encounter: Payer: Self-pay | Admitting: Family Medicine

## 2015-08-25 MED ORDER — AMLODIPINE BESYLATE 2.5 MG PO TABS: 2.5000 mg | ORAL_TABLET | Freq: Every day | ORAL | Status: DC

## 2015-08-28 ENCOUNTER — Ambulatory Visit: Payer: Managed Care, Other (non HMO) | Admitting: Family Medicine

## 2015-09-20 ENCOUNTER — Encounter: Payer: Self-pay | Admitting: Family Medicine

## 2016-01-10 ENCOUNTER — Encounter: Payer: Self-pay | Admitting: Family Medicine

## 2016-01-10 ENCOUNTER — Ambulatory Visit (INDEPENDENT_AMBULATORY_CARE_PROVIDER_SITE_OTHER): Payer: Managed Care, Other (non HMO) | Admitting: Family Medicine

## 2016-01-10 DIAGNOSIS — S39012A Strain of muscle, fascia and tendon of lower back, initial encounter: Secondary | ICD-10-CM

## 2016-01-10 MED ORDER — IBUPROFEN 600 MG PO TABS: 600.0000 mg | ORAL_TABLET | Freq: Four times a day (QID) | ORAL | Status: DC | PRN

## 2016-01-10 MED ORDER — CYCLOBENZAPRINE HCL 5 MG PO TABS: 5.0000 mg | ORAL_TABLET | Freq: Three times a day (TID) | ORAL | Status: DC | PRN

## 2016-01-10 NOTE — Progress Notes (Signed)
   Subjective:  Alexandra White is a 60 y.o. female who presents to the [facility] today with a chief complaint of low back White.   HPI:  Low Back White Patient hurt her middle lower back 3 days ago while gardening. She had to stop gardening because of the White. White is throbbing and occasionally radiates into her right leg. White is a little better today. White is worse with movement. She tried taking ibuprofen 400mg  every 4 hours which helped some. No urinary or fecal incontinence. No saddles anesthesia. No fevers or chills. Strength normal.   ROS: Per HPI  Objective:  Physical Exam: BP 132/90 mmHg  Pulse 82  Temp(Src) 97.9 F (36.6 C) (Oral)  Wt 218 lb (98.884 kg)  LMP 09/19/2010  Gen: NAD, resting comfortably MSK:  - Back: No stepoffs or deformities. No bony tenderness. Lumbar paraspinal muscles tight and tender to palpation. Straight leg raise negative bilaterally. - Legs: Strength 5/5 throughout. Sensation to light touch intac throughout.  Skin: warm, dry, no rashes  Assessment/Plan:  Low Back Strain Symptoms consistent with low back strain. No red flag signs or symptoms. Will treat with flexeril and ibuprofen. Discussed home exercises and use of heat/cold. Return precautions reviewed. Follow up as needed.   Algis Greenhouse. Alexandra White, [facility] Resident PGY-2 01/10/2016 10:49 AM

## 2016-01-10 NOTE — Patient Instructions (Addendum)
You have a a back strain. We will give you a muscle relaxer and ibuprofen.  If you are not getting better in a few weeks, let us know.  Take care,  Dr Jerline Pain   Low Back Sprain With Rehab A sprain is an injury in which a ligament is torn. The ligaments of the lower back are vulnerable to sprains. However, they are strong and require great force to be injured. These ligaments are important for stabilizing the spinal column. Sprains are classified into three categories. Grade 1 sprains cause pain, but the tendon is not lengthened. Grade 2 sprains include a lengthened ligament, due to the ligament being stretched or partially ruptured. With grade 2 sprains there is still function, although the function may be decreased. Grade 3 sprains involve a complete tear of the tendon or muscle, and function is usually impaired. SYMPTOMS   Severe pain in the lower back.  Sometimes, a feeling of a "pop," "snap," or tear, at the time of injury.  Tenderness and sometimes swelling at the injury site.  Uncommonly, bruising (contusion) within 48 hours of injury.  Muscle spasms in the back. CAUSES  Low back sprains occur when a force is placed on the ligaments that is greater than they can handle. Common causes of injury include:  Performing a stressful act while off-balance.  Repetitive stressful activities that involve movement of the lower back.  Direct hit (trauma) to the lower back. RISK INCREASES WITH:  Contact sports (football, wrestling).  Collisions (major skiing accidents).  Sports that require throwing or lifting (baseball, weightlifting).  Sports involving twisting of the spine (gymnastics, diving, tennis, golf).  Poor strength and flexibility.  Inadequate protection.  Previous back injury or surgery (especially fusion). PREVENTION  Wear properly fitted and padded protective equipment.  Warm up and stretch properly before activity.  Allow for adequate recovery between  workouts.  Maintain physical fitness:  Strength, flexibility, and endurance.  Cardiovascular fitness.  Maintain a healthy body weight. PROGNOSIS  If treated properly, low back sprains usually heal with non-surgical treatment. The length of time for healing depends on the severity of the injury.  RELATED COMPLICATIONS   Recurring symptoms, resulting in a chronic problem.  Chronic inflammation and pain in the low back.  Delayed healing or resolution of symptoms, especially if activity is resumed too soon.  Prolonged impairment.  Unstable or arthritic joints of the low back. TREATMENT  Treatment first involves the use of ice and medicine, to reduce pain and inflammation. The use of strengthening and stretching exercises may help reduce pain with activity. These exercises may be performed at home or with a therapist. Severe injuries may require referral to a therapist for further evaluation and treatment, such as ultrasound. Your caregiver may advise that you wear a back brace or corset, to help reduce pain and discomfort. Often, prolonged bed rest results in greater harm then benefit. Corticosteroid injections may be recommended. However, these should be reserved for the most serious cases. It is important to avoid using your back when lifting objects. At night, sleep on your back on a firm mattress, with a pillow placed under your knees. If non-surgical treatment is unsuccessful, surgery may be needed.  MEDICATION   If pain medicine is needed, nonsteroidal anti-inflammatory medicines (aspirin and ibuprofen), or other minor pain relievers (acetaminophen), are often advised.  Do not take pain medicine for 7 days before surgery.  Prescription pain relievers may be given, if your caregiver thinks they are needed. Use only as  directed and only as much as you need.  Ointments applied to the skin may be helpful.  Corticosteroid injections may be given by your caregiver. These injections  should be reserved for the most serious cases, because they may only be given a certain number of times. HEAT AND COLD  Cold treatment (icing) should be applied for 10 to 15 minutes every 2 to 3 hours for inflammation and pain, and immediately after activity that aggravates your symptoms. Use ice packs or an ice massage.  Heat treatment may be used before performing stretching and strengthening activities prescribed by your caregiver, physical therapist, or athletic trainer. Use a heat pack or a warm water soak. SEEK MEDICAL CARE IF:   Symptoms get worse or do not improve in 2 to 4 weeks, despite treatment.  You develop numbness or weakness in either leg.  You lose bowel or bladder function.  Any of the following occur after surgery: fever, increased pain, swelling, redness, drainage of fluids, or bleeding in the affected area.  New, unexplained symptoms develop. (Drugs used in treatment may produce side effects.) EXERCISES  RANGE OF MOTION (ROM) AND STRETCHING EXERCISES - Low Back Sprain Most people with lower back pain will find that their symptoms get worse with excessive bending forward (flexion) or arching at the lower back (extension). The exercises that will help resolve your symptoms will focus on the opposite motion.  Your physician, physical therapist or athletic trainer will help you determine which exercises will be most helpful to resolve your lower back pain. Do not complete any exercises without first consulting with your caregiver. Discontinue any exercises which make your symptoms worse, until you speak to your caregiver. If you have pain, numbness or tingling which travels down into your buttocks, leg or foot, the goal of the therapy is for these symptoms to move closer to your back and eventually resolve. Sometimes, these leg symptoms will get better, but your lower back pain may worsen. This is often an indication of progress in your rehabilitation. Be very alert to any  changes in your symptoms and the activities in which you participated in the 24 hours prior to the change. Sharing this information with your caregiver will allow him or her to most efficiently treat your condition. These exercises may help you when beginning to rehabilitate your injury. Your symptoms may resolve with or without further involvement from your physician, physical therapist or athletic trainer. While completing these exercises, remember:   Restoring tissue flexibility helps normal motion to return to the joints. This allows healthier, less painful movement and activity.  An effective stretch should be held for at least 30 seconds.  A stretch should never be painful. You should only feel a gentle lengthening or release in the stretched tissue. FLEXION RANGE OF MOTION AND STRETCHING EXERCISES: STRETCH - Flexion, Single Knee to Chest   Lie on a firm bed or floor with both legs extended in front of you.  Keeping one leg in contact with the floor, bring your opposite knee to your chest. Hold your leg in place by either grabbing behind your thigh or at your knee.  Pull until you feel a gentle stretch in your low back. Hold __________ seconds.  Slowly release your grasp and repeat the exercise with the opposite side. Repeat __________ times. Complete this exercise __________ times per day.  STRETCH - Flexion, Double Knee to Chest  Lie on a firm bed or floor with both legs extended in front of you.  Keeping one leg in contact with the floor, bring your opposite knee to your chest.  Tense your stomach muscles to support your back and then lift your other knee to your chest. Hold your legs in place by either grabbing behind your thighs or at your knees.  Pull both knees toward your chest until you feel a gentle stretch in your low back. Hold __________ seconds.  Tense your stomach muscles and slowly return one leg at a time to the floor. Repeat __________ times. Complete this  exercise __________ times per day.  STRETCH - Low Trunk Rotation  Lie on a firm bed or floor. Keeping your legs in front of you, bend your knees so they are both pointed toward the ceiling and your feet are flat on the floor.  Extend your arms out to the side. This will stabilize your upper body by keeping your shoulders in contact with the floor.  Gently and slowly drop both knees together to one side until you feel a gentle stretch in your low back. Hold for __________ seconds.  Tense your stomach muscles to support your lower back as you bring your knees back to the starting position. Repeat the exercise to the other side. Repeat __________ times. Complete this exercise __________ times per day  EXTENSION RANGE OF MOTION AND FLEXIBILITY EXERCISES: STRETCH - Extension, Prone on Elbows   Lie on your stomach on the floor, a bed will be too soft. Place your palms about shoulder width apart and at the height of your head.  Place your elbows under your shoulders. If this is too painful, stack pillows under your chest.  Allow your body to relax so that your hips drop lower and make contact more completely with the floor.  Hold this position for __________ seconds.  Slowly return to lying flat on the floor. Repeat __________ times. Complete this exercise __________ times per day.  RANGE OF MOTION - Extension, Prone Press Ups  Lie on your stomach on the floor, a bed will be too soft. Place your palms about shoulder width apart and at the height of your head.  Keeping your back as relaxed as possible, slowly straighten your elbows while keeping your hips on the floor. You may adjust the placement of your hands to maximize your comfort. As you gain motion, your hands will come more underneath your shoulders.  Hold this position __________ seconds.  Slowly return to lying flat on the floor. Repeat __________ times. Complete this exercise __________ times per day.  RANGE OF MOTION- Quadruped,  Neutral Spine   Assume a hands and knees position on a firm surface. Keep your hands under your shoulders and your knees under your hips. You may place padding under your knees for comfort.  Drop your head and point your tailbone toward the ground below you. This will round out your lower back like an angry cat. Hold this position for __________ seconds.  Slowly lift your head and release your tail bone so that your back sags into a large arch, like an old horse.  Hold this position for __________ seconds.  Repeat this until you feel limber in your low back.  Now, find your "sweet spot." This will be the most comfortable position somewhere between the two previous positions. This is your neutral spine. Once you have found this position, tense your stomach muscles to support your low back.  Hold this position for __________ seconds. Repeat __________ times. Complete this exercise __________ times per day.  STRENGTHENING EXERCISES - Low Back Sprain These exercises may help you when beginning to rehabilitate your injury. These exercises should be done near your "sweet spot." This is the neutral, low-back arch, somewhere between fully rounded and fully arched, that is your least painful position. When performed in this safe range of motion, these exercises can be used for people who have either a flexion or extension based injury. These exercises may resolve your symptoms with or without further involvement from your physician, physical therapist or athletic trainer. While completing these exercises, remember:   Muscles can gain both the endurance and the strength needed for everyday activities through controlled exercises.  Complete these exercises as instructed by your physician, physical therapist or athletic trainer. Increase the resistance and repetitions only as guided.  You may experience muscle soreness or fatigue, but the pain or discomfort you are trying to eliminate should never worsen  during these exercises. If this pain does worsen, stop and make certain you are following the directions exactly. If the pain is still present after adjustments, discontinue the exercise until you can discuss the trouble with your caregiver. STRENGTHENING - Deep Abdominals, Pelvic Tilt   Lie on a firm bed or floor. Keeping your legs in front of you, bend your knees so they are both pointed toward the ceiling and your feet are flat on the floor.  Tense your lower abdominal muscles to press your low back into the floor. This motion will rotate your pelvis so that your tail bone is scooping upwards rather than pointing at your feet or into the floor. With a gentle tension and even breathing, hold this position for __________ seconds. Repeat __________ times. Complete this exercise __________ times per day.  STRENGTHENING - Abdominals, Crunches   Lie on a firm bed or floor. Keeping your legs in front of you, bend your knees so they are both pointed toward the ceiling and your feet are flat on the floor. Cross your arms over your chest.  Slightly tip your chin down without bending your neck.  Tense your abdominals and slowly lift your trunk high enough to just clear your shoulder blades. Lifting higher can put excessive stress on the lower back and does not further strengthen your abdominal muscles.  Control your return to the starting position. Repeat __________ times. Complete this exercise __________ times per day.  STRENGTHENING - Quadruped, Opposite UE/LE Lift   Assume a hands and knees position on a firm surface. Keep your hands under your shoulders and your knees under your hips. You may place padding under your knees for comfort.  Find your neutral spine and gently tense your abdominal muscles so that you can maintain this position. Your shoulders and hips should form a rectangle that is parallel with the floor and is not twisted.  Keeping your trunk steady, lift your right hand no higher  than your shoulder and then your left leg no higher than your hip. Make sure you are not holding your breath. Hold this position for __________ seconds.  Continuing to keep your abdominal muscles tense and your back steady, slowly return to your starting position. Repeat with the opposite arm and leg. Repeat __________ times. Complete this exercise __________ times per day.  STRENGTHENING - Abdominals and Quadriceps, Straight Leg Raise   Lie on a firm bed or floor with both legs extended in front of you.  Keeping one leg in contact with the floor, bend the other knee so that your foot can rest flat  on the floor.  Find your neutral spine, and tense your abdominal muscles to maintain your spinal position throughout the exercise.  Slowly lift your straight leg off the floor about 6 inches for a count of 15, making sure to not hold your breath.  Still keeping your neutral spine, slowly lower your leg all the way to the floor. Repeat this exercise with each leg __________ times. Complete this exercise __________ times per day. POSTURE AND BODY MECHANICS CONSIDERATIONS - Low Back Sprain Keeping correct posture when sitting, standing or completing your activities will reduce the stress put on different body tissues, allowing injured tissues a chance to heal and limiting painful experiences. The following are general guidelines for improved posture. Your physician or physical therapist will provide you with any instructions specific to your needs. While reading these guidelines, remember:  The exercises prescribed by your provider will help you have the flexibility and strength to maintain correct postures.  The correct posture provides the best environment for your joints to work. All of your joints have less wear and tear when properly supported by a spine with good posture. This means you will experience a healthier, less painful body.  Correct posture must be practiced with all of your activities,  especially prolonged sitting and standing. Correct posture is as important when doing repetitive low-stress activities (typing) as it is when doing a single heavy-load activity (lifting). RESTING POSITIONS Consider which positions are most painful for you when choosing a resting position. If you have pain with flexion-based activities (sitting, bending, stooping, squatting), choose a position that allows you to rest in a less flexed posture. You would want to avoid curling into a fetal position on your side. If your pain worsens with extension-based activities (prolonged standing, working overhead), avoid resting in an extended position such as sleeping on your stomach. Most people will find more comfort when they rest with their spine in a more neutral position, neither too rounded nor too arched. Lying on a non-sagging bed on your side with a pillow between your knees, or on your back with a pillow under your knees will often provide some relief. Keep in mind, being in any one position for a prolonged period of time, no matter how correct your posture, can still lead to stiffness. PROPER SITTING POSTURE In order to minimize stress and discomfort on your spine, you must sit with correct posture. Sitting with good posture should be effortless for a healthy body. Returning to good posture is a gradual process. Many people can work toward this most comfortably by using various supports until they have the flexibility and strength to maintain this posture on their own. When sitting with proper posture, your ears will fall over your shoulders and your shoulders will fall over your hips. You should use the back of the chair to support your upper back. Your lower back will be in a neutral position, just slightly arched. You may place a small pillow or folded towel at the base of your lower back for  support.  When working at a desk, create an environment that supports good, upright posture. Without extra support,  muscles tire, which leads to excessive strain on joints and other tissues. Keep these recommendations in mind: CHAIR:  A chair should be able to slide under your desk when your back makes contact with the back of the chair. This allows you to work closely.  The chair's height should allow your eyes to be level with the upper part  of your monitor and your hands to be slightly lower than your elbows. BODY POSITION  Your feet should make contact with the floor. If this is not possible, use a foot rest.  Keep your ears over your shoulders. This will reduce stress on your neck and low back. INCORRECT SITTING POSTURES  If you are feeling tired and unable to assume a healthy sitting posture, do not slouch or slump. This puts excessive strain on your back tissues, causing more damage and pain. Healthier options include:  Using more support, like a lumbar pillow.  Switching tasks to something that requires you to be upright or walking.  Talking a brief walk.  Lying down to rest in a neutral-spine position. PROLONGED STANDING WHILE SLIGHTLY LEANING FORWARD  When completing a task that requires you to lean forward while standing in one place for a long time, place either foot up on a stationary 2-4 inch high object to help maintain the best posture. When both feet are on the ground, the lower back tends to lose its slight inward curve. If this curve flattens (or becomes too large), then the back and your other joints will experience too much stress, tire more quickly, and can cause pain. CORRECT STANDING POSTURES Proper standing posture should be assumed with all daily activities, even if they only take a few moments, like when brushing your teeth. As in sitting, your ears should fall over your shoulders and your shoulders should fall over your hips. You should keep a slight tension in your abdominal muscles to brace your spine. Your tailbone should point down to the ground, not behind your body,  resulting in an over-extended swayback posture.  INCORRECT STANDING POSTURES  Common incorrect standing postures include a forward head, locked knees and/or an excessive swayback. WALKING Walk with an upright posture. Your ears, shoulders and hips should all line-up. PROLONGED ACTIVITY IN A FLEXED POSITION When completing a task that requires you to bend forward at your waist or lean over a low surface, try to find a way to stabilize 3 out of 4 of your limbs. You can place a hand or elbow on your thigh or rest a knee on the surface you are reaching across. This will provide you more stability, so that your muscles do not tire as quickly. By keeping your knees relaxed, or slightly bent, you will also reduce stress across your lower back. CORRECT LIFTING TECHNIQUES DO :  Assume a wide stance. This will provide you more stability and the opportunity to get as close as possible to the object which you are lifting.  Tense your abdominals to brace your spine. Bend at the knees and hips. Keeping your back locked in a neutral-spine position, lift using your leg muscles. Lift with your legs, keeping your back straight.  Test the weight of unknown objects before attempting to lift them.  Try to keep your elbows locked down at your sides in order get the best strength from your shoulders when carrying an object.  Always ask for help when lifting heavy or awkward objects. INCORRECT LIFTING TECHNIQUES DO NOT:   Lock your knees when lifting, even if it is a small object.  Bend and twist. Pivot at your feet or move your feet when needing to change directions.  Assume that you can safely pick up even a paperclip without proper posture.   This information is not intended to replace advice given to you by your health care provider. Make sure you discuss any questions  you have with your health care provider.   Document Released: 07/08/2005 Document Revised: 07/29/2014 Document Reviewed:  10/20/2008 [employer]vier Interactive Patient Education [employer].

## 2016-02-07 ENCOUNTER — Telehealth: Payer: Self-pay | Admitting: *Deleted

## 2016-02-07 NOTE — Telephone Encounter (Signed)
Patient having neck pain for the past 2 days, doesn't thinks she needs to be seen but would like to speak with nurse about what she could do at home to help with the pain.

## 2016-02-07 NOTE — Telephone Encounter (Signed)
Patient stated the neck pain/stiffness usually occurs at night.  Advised patient to make sure she has a pillow to support the head and neck.  Try to use ice/heat as needed, OTC Ibuprofen for pain relief and/or neck stretches.  Patient denies any other symptoms.  Denies pain/stiff during the day.  Advised patient to call clinic back in the next couple of days if she does not get any relief from the instructions above or use of Ibuprofen.  Patient verbalized understanding.  Will forward to PCP for further advise.  Derl Barrow, RN

## 2016-02-12 ENCOUNTER — Encounter (HOSPITAL_COMMUNITY): Payer: Self-pay | Admitting: Emergency Medicine

## 2016-02-12 ENCOUNTER — Emergency Department (HOSPITAL_COMMUNITY)
Admission: EM | Admit: 2016-02-12 | Discharge: 2016-02-12 | Disposition: A | Discharge status: Home / Self Care | Payer: Self-pay | Attending: Emergency Medicine | Admitting: Emergency Medicine

## 2016-02-12 DIAGNOSIS — J4520 Mild intermittent asthma, uncomplicated: Secondary | ICD-10-CM | POA: Insufficient documentation

## 2016-02-12 DIAGNOSIS — M546 Pain in thoracic spine: Secondary | ICD-10-CM | POA: Insufficient documentation

## 2016-02-12 DIAGNOSIS — I10 Essential (primary) hypertension: Secondary | ICD-10-CM | POA: Insufficient documentation

## 2016-02-12 DIAGNOSIS — Z79899 Other long term (current) drug therapy: Secondary | ICD-10-CM | POA: Insufficient documentation

## 2016-02-12 DIAGNOSIS — Z7722 Contact with and (suspected) exposure to environmental tobacco smoke (acute) (chronic): Secondary | ICD-10-CM | POA: Insufficient documentation

## 2016-02-12 MED ORDER — KETOROLAC TROMETHAMINE 15 MG/ML IJ SOLN
15.0000 mg | Freq: Once | INTRAMUSCULAR | Status: AC
  Administered 2016-02-12: 15 mg via INTRAMUSCULAR
  Filled 2016-02-12: qty 1

## 2016-02-12 MED ORDER — MELOXICAM 15 MG PO TABS: 15.0000 mg | ORAL_TABLET | Freq: Every day | ORAL | 0 refills | Status: DC | PRN

## 2016-02-12 MED ORDER — TRAMADOL HCL 50 MG PO TABS: 50.0000 mg | ORAL_TABLET | Freq: Four times a day (QID) | ORAL | 0 refills | Status: DC | PRN

## 2016-02-12 MED ORDER — DEXAMETHASONE 4 MG PO TABS
12.0000 mg | ORAL_TABLET | Freq: Once | ORAL | Status: AC
  Administered 2016-02-12: 12 mg via ORAL
  Filled 2016-02-12: qty 3

## 2016-02-12 NOTE — ED Notes (Signed)
Patient was A & O x4.  She understood discharge instructions.

## 2016-02-12 NOTE — ED Provider Notes (Signed)
[facility] DEPT Provider Note   CSN: [id number] Arrival date & time: 02/12/16  [id number]  First Provider Contact:  None   By signing my name below, I, Alexandra White, attest that this documentation has been prepared under the direction and in the presence of Virgel Manifold, MD. Electronically Signed: Rayna White, ED Scribe. 02/12/16. 11:09 AM.   History   Chief Complaint Chief Complaint  Patient presents with  . Neck Pain  . Back Pain    HPI HPI Comments: Alexandra White is a 60 y.o. female who presents to the Emergency Department complaining of worsening, mild, atraumatic, right scapular pain x 4 days. Pt states that it began with a sharp pain to her right neck that radiated to her right shoulder and is now located across her right scapular region and radiates to her right upper arm. Her pain worsens with head movement, palpation and RUE movement and she states it is worse at night. She states she began doing rehabilitation exercises yesterday which slightly alleviate her symptoms. She has taken ibuprofen and a muscle relaxer which provide short term relief. Pt reports associated difficulty sleeping due to pain. She denies numbness, SOB and tingling.   The history is provided by the patient. No language interpreter was used.    History reviewed. No pertinent past medical history.  Patient Active Problem List   Diagnosis Date Noted  . Dizziness 08/23/2015  . Healthcare maintenance 08/23/2015  . Obesity 09/08/2012  . ASTHMA, INTERMITTENT 04/23/2010  . HTN (hypertension) 09/20/2009  . HLD (hyperlipidemia) 02/24/2008    History reviewed. No pertinent surgical history.  OB History    No data available       Home Medications    Prior to Admission medications   Medication Sig Start Date End Date Taking? Authorizing Provider  amLODipine (NORVASC) 2.5 MG tablet Take 1 tablet (2.5 mg total) by mouth daily. 08/25/15   Rosemarie Ax, MD  cyclobenzaprine (FLEXERIL) 5 MG tablet  Take 1-2 tablets (5-10 mg total) by mouth 3 (three) times daily as needed for muscle spasms. 01/10/16   Vivi Barrack, MD  ibuprofen (ADVIL,MOTRIN) 600 MG tablet Take 1 tablet (600 mg total) by mouth every 6 (six) hours as needed. 01/10/16   Vivi Barrack, MD  meclizine (ANTIVERT) 25 MG tablet Take 1 tablet (25 mg total) by mouth 3 (three) times daily as needed for dizziness. 09/08/12   Luetta Nutting, DO    Family History No family history on file.  Social History Social History  Substance Use Topics  . Smoking status: Passive Smoke Exposure - Never Smoker  . Smokeless tobacco: Never Used  . Alcohol use No     Allergies   Penicillins   Review of Systems Review of Systems  Respiratory: Negative for shortness of breath.   Musculoskeletal: Positive for arthralgias, back pain and myalgias.  Neurological: Negative for numbness.  Psychiatric/Behavioral: Positive for sleep disturbance.  All other systems reviewed and are negative.  Physical Exam Updated Vital Signs BP (!) 152/110 (BP Location: Left Arm)   Pulse 76   Temp 98.5 F (36.9 C) (Oral)   Resp 18   LMP 09/19/2010   SpO2 100%    Physical Exam  Constitutional: She is oriented to person, place, and time. She appears well-developed and well-nourished.  HENT:  Head: Normocephalic.  Eyes: EOM are normal.  Neck: Normal range of motion.  Pulmonary/Chest: Effort normal.  Abdominal: She exhibits no distension.  Musculoskeletal: Normal range of motion. She exhibits tenderness.  Mild TTP to right scapular region; pain increased with shoulder flexion; neurovascularly intact; no midline spinal tenderness  Neurological: She is alert and oriented to person, place, and time.  Psychiatric: She has a normal mood and affect.  Nursing note and vitals reviewed.    ED Treatments / Results  Labs (all labs ordered are listed, but only abnormal results are displayed) Labs Reviewed - No data to display  EKG  EKG  Interpretation None       Radiology No results found.  Procedures Procedures  DIAGNOSTIC STUDIES: Oxygen Saturation is 100% on RA, normal by my interpretation.    COORDINATION OF CARE: 11:05 AM Discussed next steps with pt. Pt verbalized understanding and is agreeable with the plan.    Medications Ordered in ED Medications - No data to display   Initial Impression / Assessment and Plan / ED Course  I have reviewed the triage vital signs and the nursing notes.  Pertinent labs & imaging results that were available during my care of the patient were reviewed by me and considered in my medical decision making (see chart for details).  Clinical Course   60 year old female with left upper back pain. She is very consistent with musculoskeletal pain. Reproducible palpation and range of motion. Neurovascular intact. Doubt ACS, PE, dissection or other emergent process. Plan symptomatic treatment. Return precautions discussed. Outpatient follow-up otherwise.  I personally preformed the services scribed in my presence. The recorded information has been reviewed is accurate. Virgel Manifold, MD.   Final Clinical Impressions(s) / ED Diagnoses   Final diagnoses:  Right-sided thoracic back pain    New Prescriptions New Prescriptions   No medications on file     Virgel Manifold, MD 02/26/16 1021

## 2016-02-12 NOTE — ED Triage Notes (Signed)
Patient here from home with complaints of right sided neck pain and stiffness radiating down in to shoulder blade. Pain 10/10 unrelieved with ibuprofen and muscle relaxants.

## 2017-05-21 ENCOUNTER — Ambulatory Visit: Payer: 59 | Admitting: Internal Medicine

## 2018-03-24 ENCOUNTER — Ambulatory Visit: Payer: 59 | Admitting: Family Medicine

## 2018-07-07 ENCOUNTER — Ambulatory Visit: Payer: 59 | Admitting: Student in an Organized Health Care Education/Training Program

## 2018-08-05 ENCOUNTER — Ambulatory Visit: Payer: 59 | Admitting: Family Medicine

## 2018-09-15 ENCOUNTER — Encounter: Payer: Self-pay | Admitting: Student in an Organized Health Care Education/Training Program

## 2018-09-26 ENCOUNTER — Other Ambulatory Visit: Payer: Self-pay

## 2018-09-26 ENCOUNTER — Encounter (HOSPITAL_COMMUNITY): Payer: Self-pay | Admitting: Emergency Medicine

## 2018-09-26 ENCOUNTER — Emergency Department (HOSPITAL_COMMUNITY)
Admission: EM | Admit: 2018-09-26 | Discharge: 2018-09-26 | Disposition: A | Discharge status: Home / Self Care | Payer: 59 | Attending: Emergency Medicine | Admitting: Emergency Medicine

## 2018-09-26 DIAGNOSIS — Z7722 Contact with and (suspected) exposure to environmental tobacco smoke (acute) (chronic): Secondary | ICD-10-CM | POA: Insufficient documentation

## 2018-09-26 DIAGNOSIS — M436 Torticollis: Secondary | ICD-10-CM | POA: Diagnosis not present

## 2018-09-26 DIAGNOSIS — M25512 Pain in left shoulder: Secondary | ICD-10-CM | POA: Diagnosis present

## 2018-09-26 DIAGNOSIS — Z79899 Other long term (current) drug therapy: Secondary | ICD-10-CM | POA: Diagnosis not present

## 2018-09-26 DIAGNOSIS — R030 Elevated blood-pressure reading, without diagnosis of hypertension: Secondary | ICD-10-CM

## 2018-09-26 DIAGNOSIS — J45909 Unspecified asthma, uncomplicated: Secondary | ICD-10-CM | POA: Diagnosis not present

## 2018-09-26 DIAGNOSIS — I10 Essential (primary) hypertension: Secondary | ICD-10-CM | POA: Insufficient documentation

## 2018-09-26 MED ORDER — PREDNISONE 10 MG PO TABS: 40.0000 mg | ORAL_TABLET | Freq: Every day | ORAL | 0 refills | Status: DC

## 2018-09-26 MED ORDER — CYCLOBENZAPRINE HCL 10 MG PO TABS: 10.0000 mg | ORAL_TABLET | Freq: Two times a day (BID) | ORAL | 0 refills | Status: DC | PRN

## 2018-09-26 NOTE — ED Triage Notes (Signed)
Patient arrived by self from home. Pt c/o LFT shoulder pain that goes into the LFT side of neck. Pt states that her neck feels stiff. Pt states that she had a similar issue a few years ago.

## 2018-09-26 NOTE — ED Notes (Signed)
Patient given discharge teaching and verbalized understanding. Patient ambulated out of ED with a steady gait. 

## 2018-09-26 NOTE — ED Provider Notes (Signed)
[facility] DEPT Provider Note   CSN: [id number] Arrival date & time: 09/26/18  1127    History   Chief Complaint Chief Complaint  Patient presents with  . Shoulder Pain    HPI Alexandra White is a 63 y.o. female with left shoulder pain that radiates to the left side of neck. The pain started a couple days ago. She noted the pain when she woke one morning.  Patient reports recently getting over a cold with congestion.      HPI  History reviewed. No pertinent past medical history.  Patient Active Problem List   Diagnosis Date Noted  . Dizziness 08/23/2015  . Healthcare maintenance 08/23/2015  . Obesity 09/08/2012  . ASTHMA, INTERMITTENT 04/23/2010  . HTN (hypertension) 09/20/2009  . HLD (hyperlipidemia) 02/24/2008    History reviewed. No pertinent surgical history.   OB History   No obstetric history on file.      Home Medications    Prior to Admission medications   Medication Sig Start Date End Date Taking? Authorizing Provider  amLODipine (NORVASC) 2.5 MG tablet Take 1 tablet (2.5 mg total) by mouth daily. 08/25/15   Rosemarie Ax, MD  cyclobenzaprine (FLEXERIL) 10 MG tablet Take 1 tablet (10 mg total) by mouth 2 (two) times daily as needed for muscle spasms. 09/26/18   Ashley Murrain, NP  meclizine (ANTIVERT) 25 MG tablet Take 1 tablet (25 mg total) by mouth 3 (three) times daily as needed for dizziness. 09/08/12   Luetta Nutting, DO  predniSONE (DELTASONE) 10 MG tablet Take 4 tablets (40 mg total) by mouth daily with breakfast. 09/26/18   Ashley Murrain, NP    Family History History reviewed. No pertinent family history.  Social History Social History   Tobacco Use  . Smoking status: Passive Smoke Exposure - Never Smoker  . Smokeless tobacco: Never Used  Substance Use Topics  . Alcohol use: No  . Drug use: Not on file     Allergies   Penicillins   Review of Systems Review of Systems  Musculoskeletal: Positive for  arthralgias and neck pain.  All other systems reviewed and are negative.    Physical Exam Updated Vital Signs BP (!) 144/105 (BP Location: Right Arm)   Pulse 71   Temp 97.8 F (36.6 C) (Oral)   Resp 14   Ht 5\' 7"  (1.702 m)   Wt 90.7 kg   LMP 09/19/2010   SpO2 99%   BMI 31.32 kg/m   Physical Exam Vitals signs and nursing note reviewed.  Constitutional:      General: She is not in acute distress.    Appearance: She is well-developed.  HENT:     Head: Normocephalic.     Right Ear: Tympanic membrane normal.     Left Ear: Tympanic membrane normal.     Nose: Nose normal.     Mouth/Throat:     Mouth: Mucous membranes are moist.     Pharynx: Oropharynx is clear.  Eyes:     Conjunctiva/sclera: Conjunctivae normal.  Neck:     Musculoskeletal: Neck supple. Pain with movement, torticollis and muscular tenderness present. No neck rigidity or spinous process tenderness.     Comments: Patient with muscle spasm of the trapezius muscle  Cardiovascular:     Rate and Rhythm: Normal rate.  Pulmonary:     Effort: Pulmonary effort is normal.  Musculoskeletal: Normal range of motion.  Skin:    General: Skin is warm and dry.  Neurological:  Mental Status: She is alert and oriented to person, place, and time.     Cranial Nerves: No cranial nerve deficit.  Psychiatric:        Mood and Affect: Mood normal.      ED Treatments / Results  Labs (all labs ordered are listed, but only abnormal results are displayed) Labs Reviewed - No data to display  Radiology No results found.  Procedures Procedures (including critical care time)  Medications Ordered in ED Medications - No data to display   Initial Impression / Assessment and Plan / ED Course  I have reviewed the triage vital signs and the nursing notes. 63 y.o. female here with left side neck pain that goes to the left shoulder stable for d/c without fever or meningeal signs. Will treat with short steroid burst and muscle  relaxer. Patient to f/u with PCP. Return precautions discussed. Patient to f/u for elevated BP.   Final Clinical Impressions(s) / ED Diagnoses   Final diagnoses:  Torticollis, acute  Elevated blood pressure reading    ED Discharge Orders         Ordered    predniSONE (DELTASONE) 10 MG tablet  Daily with breakfast     09/26/18 1211    cyclobenzaprine (FLEXERIL) 10 MG tablet  2 times daily PRN     09/26/18 1211           Janit Bern [place], NP 09/26/18 1323    Tegeler, Gwenyth Allegra, MD 09/26/18 [id number]

## 2018-09-26 NOTE — Discharge Instructions (Signed)
Do not drive while taking the muscle relaxer. If it makes you to sleepy you can take 1/2 of the tablet. Follow up with your doctor. Return here as needed.

## 2018-10-05 ENCOUNTER — Ambulatory Visit: Payer: 59 | Admitting: Family Medicine

## 2018-10-05 ENCOUNTER — Other Ambulatory Visit: Payer: Self-pay

## 2018-10-05 ENCOUNTER — Ambulatory Visit (INDEPENDENT_AMBULATORY_CARE_PROVIDER_SITE_OTHER): Payer: 59 | Admitting: Family Medicine

## 2018-10-05 ENCOUNTER — Encounter: Payer: Self-pay | Admitting: Family Medicine

## 2018-10-05 VITALS — BP 138/64 | HR 74 | Ht 67.0 in | Wt 207.0 lb

## 2018-10-05 DIAGNOSIS — M62838 Other muscle spasm: Secondary | ICD-10-CM

## 2018-10-05 MED ORDER — METAXALONE 800 MG PO TABS: 400.0000 mg | ORAL_TABLET | Freq: Three times a day (TID) | ORAL | 0 refills | Status: DC

## 2018-10-05 NOTE — Progress Notes (Signed)
   Subjective:    Patient ID: Alexandra White is a 63 y.o. female presenting with neck stiffness  on 10/05/2018  HPI: Reports 2 wks of neck stiffness following URI/allergies. Had some h/o bursitis. Then she felt better. It feels stiffness. Worse with certain movements and radiates to shoulder. Seen in ED, given muscle relaxers which is making her sleepy. Given steroids as well, and she has not taken this as directed. Has tried some heat, occasioanally .  Review of Systems  Constitutional: Negative for chills and fever.  Respiratory: Negative for shortness of breath.   Cardiovascular: Negative for chest pain.  Gastrointestinal: Negative for abdominal pain, nausea and vomiting.  Genitourinary: Negative for dysuria.  Skin: Negative for rash.      Objective:    BP 138/64   Pulse 74   Ht 5\' 7"  (1.702 m)   Wt 207 lb (93.9 kg)   LMP 09/19/2010   BMI 32.42 kg/m  Physical Exam Constitutional:      General: She is not in acute distress.    Appearance: She is well-developed.  HENT:     Head: Normocephalic and atraumatic.  Eyes:     General: No scleral icterus. Neck:     Musculoskeletal: Neck supple.  Cardiovascular:     Rate and Rhythm: Normal rate.  Pulmonary:     Effort: Pulmonary effort is normal.  Abdominal:     Palpations: Abdomen is soft.  Musculoskeletal:     Comments: Trapezius muscle on left is firm and in spasm  Skin:    General: Skin is warm and dry.  Neurological:     Mental Status: She is alert and oriented to person, place, and time.         Assessment & Plan:  Muscle spasm - trial of heat, + change to skelaxin (still may cause drowsiness), take steroid, may use Ibuprofen once that is complete. - Plan: metaxalone (SKELAXIN) 800 MG tablet   Total face-to-face time with patient: 15 minutes. Over 50% of encounter was spent on counseling and coordination of care. Return if symptoms worsen or fail to improve.  Donnamae Jude 10/05/2018 9:34 AM

## 2018-10-05 NOTE — Patient Instructions (Signed)
Muscle Cramps and Spasms Muscle cramps and spasms are when muscles tighten by themselves. They usually get better within minutes. Muscle cramps are painful. They are usually stronger and last longer than muscle spasms. Muscle spasms may or may not be painful. They can last a few seconds or much longer. Cramps and spasms can affect any muscle, but they occur most often in the calf muscles of the leg. They are usually not caused by a serious problem. In many cases, the cause is not known. Some common causes include:  Doing more physical work or exercise than your body is ready for.  Using the muscles too much (overuse) by repeating certain movements too many times.  Staying in a certain position for a long time.  Playing a sport or doing an activity without preparing properly.  Using bad form or technique while playing a sport or doing an activity.  Not having enough water in your body (dehydration).  Injury.  Side effects of some medicines.  Low levels of the salts and minerals in your blood (electrolytes), such as low potassium or calcium. Follow these instructions at home: Managing pain and stiffness      Massage, stretch, and relax the muscle. Do this for many minutes at a time.  If told, put heat on tight or tense muscles as often as told by your doctor. Use the heat source that your doctor recommends, such as a moist heat pack or a heating pad. ? Place a towel between your skin and the heat source. ? Leave the heat on for 20-30 minutes. ? Remove the heat if your skin turns bright red. This is very important if you are not able to feel pain, heat, or cold. You may have a greater risk of getting burned.  If told, put ice on the affected area. This may help if you are sore or have pain after a cramp or spasm. ? Put ice in a plastic bag. ? Place a towel between your skin and the bag. ? Leave the ice on for 20 minutes, 2-3 times a day.  Try taking hot showers or baths to help  relax tight muscles. Eating and drinking  Drink enough fluid to keep your pee (urine) pale yellow.  Eat a healthy diet to help ensure that your muscles work well. This should include: ? Fruits and vegetables. ? Lean protein. ? Whole grains. ? Low-fat or nonfat dairy products. General instructions  If you are having cramps often, avoid intense exercise for several days.  Take over-the-counter and prescription medicines only as told by your doctor.  Watch for any changes in your symptoms.  Keep all follow-up visits as told by your doctor. This is important. Contact a doctor if:  Your cramps or spasms get worse or happen more often.  Your cramps or spasms do not get better with time. Summary  Muscle cramps and spasms are when muscles tighten by themselves. They usually get better within minutes.  Cramps and spasms occur most often in the calf muscles of the leg.  Massage, stretch, and relax the muscle. This may help the cramp or spasm go away.  Drink enough fluid to keep your pee (urine) pale yellow. This information is not intended to replace advice given to you by your health care provider. Make sure you discuss any questions you have with your health care provider. Document Released: 06/20/2008 Document Revised: 12/01/2017 Document Reviewed: 12/01/2017 Elsevier Interactive Patient Education  2019 [employer]  

## 2019-02-25 ENCOUNTER — Encounter: Payer: 59 | Admitting: Student in an Organized Health Care Education/Training Program

## 2019-03-11 ENCOUNTER — Ambulatory Visit (INDEPENDENT_AMBULATORY_CARE_PROVIDER_SITE_OTHER): Payer: 59 | Admitting: Student in an Organized Health Care Education/Training Program

## 2019-03-11 ENCOUNTER — Other Ambulatory Visit (HOSPITAL_COMMUNITY)
Admission: RE | Admit: 2019-03-11 | Discharge: 2019-03-11 | Disposition: A | Discharge status: Home / Self Care | Payer: 59 | Source: Ambulatory Visit | Attending: Family Medicine | Admitting: Family Medicine

## 2019-03-11 ENCOUNTER — Encounter: Payer: Self-pay | Admitting: Student in an Organized Health Care Education/Training Program

## 2019-03-11 ENCOUNTER — Other Ambulatory Visit: Payer: Self-pay

## 2019-03-11 VITALS — BP 124/80 | HR 83 | Wt 204.0 lb

## 2019-03-11 DIAGNOSIS — Z129 Encounter for screening for malignant neoplasm, site unspecified: Secondary | ICD-10-CM | POA: Insufficient documentation

## 2019-03-11 DIAGNOSIS — I10 Essential (primary) hypertension: Secondary | ICD-10-CM

## 2019-03-11 DIAGNOSIS — Z0000 Encounter for general adult medical examination without abnormal findings: Secondary | ICD-10-CM | POA: Diagnosis not present

## 2019-03-11 NOTE — Progress Notes (Signed)
   Subjective:    Patient ID: Alexandra White, female    DOB: 12-09-1955, 63 y.o.   MRN: [id number]   CC: Physical  HPI:  Patient presents today with no specific complaints.  She states that she is feeling well overall other than some increased fatigue over the last month.  She is getting poor sleep at night with getting up 2-3 times per night.  She denies any changes in lifestyle that could be contributing to this change in sleep.  She will occasionally take naps during the day which make her feel better.  She has cut back on her caffeine consumption already that makes her feel better.   Patient was previously treated for hypertension with amlodipine.  He states that she has had normal blood pressure and not been on amlodipine for several years.  She states that she has occasional mild headache but no chest pain, or shortness of breath.  She will have occasional lower extremity edema when she is up and walking for more of the day and is resolved with elevating feet.  She has had about a 4 pound weight gain since her last appointment per patient.  He states that the reason why her blood pressure was elevated on original diagnosis was likely due to stress from her husband's health concerns.  Pressure is 124/80 today.   Smoking status reviewed   ROS: pertinent noted in the HPI   Past medical history, surgical, family, and social history reviewed and updated in the EMR as appropriate.  Objective:  BP 124/80   Pulse 83   Wt 204 lb (92.5 kg)   LMP 09/19/2010   SpO2 98%   BMI 31.95 kg/m   Vitals and nursing note reviewed  General: NAD, pleasant, able to participate in exam Cardiac: RRR, S1 S2 present. normal heart sounds, no murmurs. Respiratory: CTAB, normal effort, No wheezes, rales or rhonchi Extremities: no edema or cyanosis. Skin: warm and dry, no rashes noted Neuro: alert, no obvious focal deficits Psych: Normal affect and mood   Assessment & Plan:    HTN (hypertension)  Patient denies taking any BP meds for several years. BP well controlled today. 124/80 Will remove medication from med list and continue to monitor  Cancer screening Pap smear completed today- will inform patient of results and discussed possibility of discontinuing these screenings since all have been normal to this point. Put referral in for mammogram and colonoscopy. Discussed indication with patient.   Healthcare maintenance Screening for cholesterol today and fatigue.  Lipid panel and CBC. BMP for history of HTN.     Doristine Mango, [facility] PGY-2

## 2019-03-11 NOTE — Patient Instructions (Signed)
It was a pleasure to see you today!  To summarize our discussion for this visit:  We are checking cancer screening today with a Pap smear and putting in orders for mammogram and colonoscopy.  We are checking blood work to monitor kidney function and cholesterol and fatigue  Your heart sounded normal today  Some additional health maintenance measures we should update are: Health Maintenance Due  Topic Date Due  . TETANUS/TDAP  04/25/1976  . MAMMOGRAM  04/26/2007  . COLONOSCOPY  04/26/2007  . PAP SMEAR-Modifier  08/30/2008  . INFLUENZA VACCINE  02/20/2019  . Received tetanus shot when back in stock at the clinic   Please return to our clinic to see me if you have any other concerns about your heart.  Call the clinic at [phone] if your symptoms worsen or you have any concerns.   Thank you for allowing me to take part in your care,  Dr. Doristine Mango

## 2019-03-11 NOTE — Assessment & Plan Note (Signed)
Pap smear completed today- will inform patient of results and discussed possibility of discontinuing these screenings since all have been normal to this point. Put referral in for mammogram and colonoscopy. Discussed indication with patient.

## 2019-03-11 NOTE — Assessment & Plan Note (Signed)
Screening for cholesterol today and fatigue.  Lipid panel and CBC. BMP for history of HTN.

## 2019-03-11 NOTE — Assessment & Plan Note (Signed)
Patient denies taking any BP meds for several years. BP well controlled today. 124/80 Will remove medication from med list and continue to monitor

## 2019-03-12 LAB — CBC
Hematocrit: 41 % (ref 34.0–46.6)
Hemoglobin: 13.5 g/dL (ref 11.1–15.9)
MCH: 27.5 pg (ref 26.6–33.0)
MCHC: 32.9 g/dL (ref 31.5–35.7)
MCV: 84 fL (ref 79–97)
Platelets: 243 10*3/uL (ref 150–450)
RBC: 4.91 x10E6/uL (ref 3.77–5.28)
RDW: 13.2 % (ref 11.7–15.4)
WBC: 3.6 10*3/uL (ref 3.4–10.8)

## 2019-03-12 LAB — LIPID PANEL
Chol/HDL Ratio: 3.1 ratio (ref 0.0–4.4)
Cholesterol, Total: 223 mg/dL — ABNORMAL HIGH (ref 100–199)
HDL: 72 mg/dL (ref >39)
LDL Calculated: 131 mg/dL — ABNORMAL HIGH (ref 0–99)
Triglycerides: 100 mg/dL (ref 0–149)
VLDL Cholesterol Cal: 20 mg/dL (ref 5–40)

## 2019-03-12 LAB — BASIC METABOLIC PANEL
BUN/Creatinine Ratio: 22 (ref 12–28)
BUN: 18 mg/dL (ref 8–27)
CO2: 19 mmol/L — ABNORMAL LOW (ref 20–29)
Calcium: 9.4 mg/dL (ref 8.7–10.3)
Chloride: 107 mmol/L — ABNORMAL HIGH (ref 96–106)
Creatinine, Ser: 0.82 mg/dL (ref 0.57–1.00)
GFR calc Af Amer: 89 mL/min/{1.73_m2} (ref >59)
GFR calc non Af Amer: 77 mL/min/{1.73_m2} (ref >59)
Glucose: 97 mg/dL (ref 65–99)
Potassium: 4.5 mmol/L (ref 3.5–5.2)
Sodium: 140 mmol/L (ref 134–144)

## 2019-03-12 NOTE — Addendum Note (Signed)
Addended by: Richarda Osmond on: 03/12/2019 04:43 PM   Modules accepted: Level of Service

## 2019-03-15 LAB — CYTOLOGY - PAP
Diagnosis: NEGATIVE
HPV: NOT DETECTED

## 2019-03-16 ENCOUNTER — Encounter: Payer: Self-pay | Admitting: Gastroenterology

## 2019-03-23 ENCOUNTER — Telehealth: Payer: Self-pay | Admitting: Student in an Organized Health Care Education/Training Program

## 2019-03-23 NOTE — Telephone Encounter (Signed)
Pt is calling and would like someone to call her with the results from her appointment on 08/20. Pt said she knows they usually send a results letter out but said she has not received it and prefers to speak with someone over the phone.   Please call patient to discuss.

## 2019-03-25 ENCOUNTER — Telehealth: Payer: Self-pay | Admitting: *Deleted

## 2019-03-25 NOTE — Telephone Encounter (Signed)
Patient no showed for Previsit appointment. Called patient with message left to call back prior to 5 pm today to reschedule Previsit to avoid cancellation of procedure.

## 2019-04-02 ENCOUNTER — Ambulatory Visit: Payer: 59 | Admitting: Student in an Organized Health Care Education/Training Program

## 2019-04-09 ENCOUNTER — Encounter: Payer: 59 | Admitting: Gastroenterology

## 2019-07-09 ENCOUNTER — Ambulatory Visit (INDEPENDENT_AMBULATORY_CARE_PROVIDER_SITE_OTHER): Payer: 59 | Admitting: Family Medicine

## 2019-07-09 ENCOUNTER — Other Ambulatory Visit: Payer: Self-pay

## 2019-07-09 VITALS — BP 156/94 | HR 85 | Wt 204.0 lb

## 2019-07-09 DIAGNOSIS — G4700 Insomnia, unspecified: Secondary | ICD-10-CM

## 2019-07-09 DIAGNOSIS — R5383 Other fatigue: Secondary | ICD-10-CM

## 2019-07-09 DIAGNOSIS — Z20822 Contact with and (suspected) exposure to covid-19: Secondary | ICD-10-CM

## 2019-07-09 DIAGNOSIS — R350 Frequency of micturition: Secondary | ICD-10-CM

## 2019-07-09 DIAGNOSIS — R7303 Prediabetes: Secondary | ICD-10-CM

## 2019-07-09 LAB — POCT GLYCOSYLATED HEMOGLOBIN (HGB A1C): Hemoglobin A1C: 5.7 % — AB (ref 4.0–5.6)

## 2019-07-09 NOTE — Patient Instructions (Signed)
It was great meeting you today!  I am sorry you had so much trouble sleeping recently.  Getting up 3 or 4 times at night to use the restroom is concerning for possible diabetes.  For that we will check for diabetes.  Your fatigue and trouble sleeping also be related to a blood count problem or a thyroid problem so we will check these as well.  Sometimes electrolyte problems can also cause these type symptoms.  Lastly think it is a good idea to get Covid test.  Feeling off, tired, with muscle ache could potentially be symptoms of this.  Although it sounds like you are quite low risk as she has been staying safe.  I think your blood pressure might be due to you not sleeping well so I will not start medication at this time.  I would like to see you back either with myself or your PCP in 2 or 3 weeks to recheck your blood pressure.  High blood pressure, headaches, trouble sleeping could also be due to sleep apnea and if we will find anything with this initial work-up I will refer you to get a sleep study.

## 2019-07-09 NOTE — Progress Notes (Signed)
HPI 63 year old female who presents for trouble sleeping.  Patient states that for about the last week she has had increased difficulty sleeping.  She states that this is far off in his baseline she normally goes to bed without any difficulty and typically gets between 8 and 10 hours of sleep every night.  She states that when she goes to bed recently she cannot fall asleep easily as her mind is racing, about nothing in particular, and then she has trouble staying asleep she does fall asleep.  She is also complaining about fatigue.  She states that she has felt more tired recently than usual and this is off from her baseline as well.  She is that she has felt tired throughout the day.  She states that she has not been napping.  Increase her sleep hygiene she typically tries to sleep with a TV on.  She routinely drinks tea at night and does not think it is decaf.  States that she is typically doing work right up until bedtime.  CC: Trouble sleeping, fatigue   ROS:   Review of Systems See HPI for ROS.   CC, SH/smoking status, and VS noted  Objective: BP (!) 156/94   Pulse 85   Wt 204 lb (92.5 kg)   LMP 09/19/2010   SpO2 98%   BMI 31.95 kg/m  Gen: 63 year old African-American female, no acute distress, resting comfortably HEENT: Normal appearing conjunctiva, no pale mucous membrane CV: Regular rate and rhythm, no M/R/G Resp: Lungs clear to auscultation bilaterally, no accessory muscle use Neuro: Alert and oriented, Speech clear, No focal neurologic deficit   Assessment and plan:  Insomnia Patient complaining of both trouble going to sleep and trouble staying asleep over the last few days.  Also complains of increased fatigue over this time.  Will check TSH, BMP to make sure no electrolyte disturbances causing this.  I did spend significant amount of time discussing sleep hygiene for patient.  I believe she has significant room for improvement in this area given her late caffeine  consumption, work up until sleep time, and activating stimuli in the room while trying to sleep.  Patient can try melatonin Gummies to see if these help.  Fatigue Patient complaining of fatigue.  Been going on for about a week and is likely related to her subsequent trouble sleeping.  I did discuss symptoms/risk factors for sleep apnea she states that she has had no gasping for air, no frequent nighttime awakening, and no other symptoms.  Will check TSH, CBC, BMP to check if there is any organic cause of her symptoms.  If no abnormality appreciated will chalked this problem to her poor sleep see how she does with sleep hygiene improvement.  Pre-diabetes Hemoglobin A1c at 5.7 placing her in prediabetic range.  Can be managed with diet and exercise at this time.  Follow-up in 3 months for recheck.   Orders Placed This Encounter  Procedures  . Novel Coronavirus, NAA ([facility])    Order Specific Question:   Is this test for diagnosis or screening    Answer:   Screening    Order Specific Question:   Symptomatic for COVID-19 as defined by CDC    Answer:   No    Order Specific Question:   Hospitalized for COVID-19    Answer:   No    Order Specific Question:   Admitted to ICU for COVID-19    Answer:   No    Order Specific Question:  Previously tested for COVID-19    Answer:   No    Order Specific Question:   Resident in a congregate (group) care setting    Answer:   No    Order Specific Question:   Is the patient student?    Answer:   No    Order Specific Question:   Employed in healthcare setting    Answer:   No    Order Specific Question:   Pregnant    Answer:   No  . Basic Metabolic Panel  . CBC with Differential  . TSH  . POC Hemoglobin A1c (dx code Z13.1)    Associate with Z13.1    No orders of the defined types were placed in this encounter.  Guadalupe Dawn MD PGY-3 Family Medicine Resident  07/16/2019 8:22 PM

## 2019-07-10 LAB — BASIC METABOLIC PANEL
BUN/Creatinine Ratio: 15 (ref 12–28)
BUN: 10 mg/dL (ref 8–27)
CO2: 23 mmol/L (ref 20–29)
Calcium: 9.5 mg/dL (ref 8.7–10.3)
Chloride: 105 mmol/L (ref 96–106)
Creatinine, Ser: 0.67 mg/dL (ref 0.57–1.00)
GFR calc Af Amer: 109 mL/min/{1.73_m2} (ref >59)
GFR calc non Af Amer: 95 mL/min/{1.73_m2} (ref >59)
Glucose: 83 mg/dL (ref 65–99)
Potassium: 3.5 mmol/L (ref 3.5–5.2)
Sodium: 140 mmol/L (ref 134–144)

## 2019-07-10 LAB — CBC WITH DIFFERENTIAL/PLATELET
Basophils Absolute: 0 10*3/uL (ref 0.0–0.2)
Basos: 1 %
EOS (ABSOLUTE): 0.2 10*3/uL (ref 0.0–0.4)
Eos: 4 %
Hematocrit: 40.6 % (ref 34.0–46.6)
Hemoglobin: 13.2 g/dL (ref 11.1–15.9)
Immature Grans (Abs): 0 10*3/uL (ref 0.0–0.1)
Immature Granulocytes: 0 %
Lymphocytes Absolute: 2.2 10*3/uL (ref 0.7–3.1)
Lymphs: 46 %
MCH: 27.5 pg (ref 26.6–33.0)
MCHC: 32.5 g/dL (ref 31.5–35.7)
MCV: 85 fL (ref 79–97)
Monocytes Absolute: 0.5 10*3/uL (ref 0.1–0.9)
Monocytes: 11 %
Neutrophils Absolute: 1.8 10*3/uL (ref 1.4–7.0)
Neutrophils: 38 %
Platelets: 267 10*3/uL (ref 150–450)
RBC: 4.8 x10E6/uL (ref 3.77–5.28)
RDW: 13.3 % (ref 11.7–15.4)
WBC: 4.7 10*3/uL (ref 3.4–10.8)

## 2019-07-10 LAB — TSH: TSH: 2.96 u[IU]/mL (ref 0.450–4.500)

## 2019-07-16 DIAGNOSIS — R7303 Prediabetes: Secondary | ICD-10-CM | POA: Insufficient documentation

## 2019-07-16 DIAGNOSIS — G4700 Insomnia, unspecified: Secondary | ICD-10-CM | POA: Insufficient documentation

## 2019-07-16 DIAGNOSIS — R5383 Other fatigue: Secondary | ICD-10-CM | POA: Insufficient documentation

## 2019-07-16 NOTE — Assessment & Plan Note (Signed)
Hemoglobin A1c at 5.7 placing her in prediabetic range.  Can be managed with diet and exercise at this time.  Follow-up in 3 months for recheck.

## 2019-07-16 NOTE — Assessment & Plan Note (Signed)
Patient complaining of fatigue.  Been going on for about a week and is likely related to her subsequent trouble sleeping.  I did discuss symptoms/risk factors for sleep apnea she states that she has had no gasping for air, no frequent nighttime awakening, and no other symptoms.  Will check TSH, CBC, BMP to check if there is any organic cause of her symptoms.  If no abnormality appreciated will chalked this problem to her poor sleep see how she does with sleep hygiene improvement.

## 2019-07-16 NOTE — Assessment & Plan Note (Signed)
Patient complaining of both trouble going to sleep and trouble staying asleep over the last few days.  Also complains of increased fatigue over this time.  Will check TSH, BMP to make sure no electrolyte disturbances causing this.  I did spend significant amount of time discussing sleep hygiene for patient.  I believe she has significant room for improvement in this area given her late caffeine consumption, work up until sleep time, and activating stimuli in the room while trying to sleep.  Patient can try melatonin Gummies to see if these help.

## 2019-08-12 ENCOUNTER — Telehealth: Payer: Self-pay

## 2019-08-12 NOTE — Telephone Encounter (Signed)
Pt calls nurse line requesting results from 07/09/19 office visit. I was not able to find any results notes to notify patient of.   Routing to PCP and ordering provider   Talbot Grumbling, RN

## 2019-08-17 NOTE — Telephone Encounter (Signed)
Thanks Jarrett Soho,  From what I can see, it looks like her labs from that visit were all normal other than her A1c which was in the prediabetes range but Maylon Cos saw her so I'd see what he has to say about them

## 2019-08-30 NOTE — Telephone Encounter (Signed)
Nothing more to add. Lifestyle changes to help with the insomnia and follow up with pcp as I already discussed with the patient.  Guadalupe Dawn MD PGY-3 Family Medicine Resident

## 2019-11-15 ENCOUNTER — Encounter (HOSPITAL_COMMUNITY): Payer: Self-pay | Admitting: Emergency Medicine

## 2019-11-15 ENCOUNTER — Emergency Department (HOSPITAL_COMMUNITY)
Admission: EM | Admit: 2019-11-15 | Discharge: 2019-11-15 | Disposition: A | Discharge status: Home / Self Care | Payer: 59 | Attending: Emergency Medicine | Admitting: Emergency Medicine

## 2019-11-15 ENCOUNTER — Other Ambulatory Visit: Payer: Self-pay

## 2019-11-15 DIAGNOSIS — R110 Nausea: Secondary | ICD-10-CM | POA: Insufficient documentation

## 2019-11-15 DIAGNOSIS — R0789 Other chest pain: Secondary | ICD-10-CM | POA: Insufficient documentation

## 2019-11-15 DIAGNOSIS — K5900 Constipation, unspecified: Secondary | ICD-10-CM | POA: Insufficient documentation

## 2019-11-15 DIAGNOSIS — I10 Essential (primary) hypertension: Secondary | ICD-10-CM | POA: Insufficient documentation

## 2019-11-15 DIAGNOSIS — Z7722 Contact with and (suspected) exposure to environmental tobacco smoke (acute) (chronic): Secondary | ICD-10-CM | POA: Diagnosis not present

## 2019-11-15 DIAGNOSIS — J45909 Unspecified asthma, uncomplicated: Secondary | ICD-10-CM | POA: Insufficient documentation

## 2019-11-15 DIAGNOSIS — R1012 Left upper quadrant pain: Secondary | ICD-10-CM | POA: Diagnosis present

## 2019-11-15 LAB — URINALYSIS, ROUTINE W REFLEX MICROSCOPIC
Bilirubin Urine: NEGATIVE
Glucose, UA: NEGATIVE mg/dL
Hgb urine dipstick: NEGATIVE
Ketones, ur: 20 mg/dL — AB
Nitrite: NEGATIVE
Protein, ur: NEGATIVE mg/dL
Specific Gravity, Urine: 1.018 (ref 1.005–1.030)
WBC, UA: >50 WBC/hpf — ABNORMAL HIGH (ref 0–5)
pH: 6 (ref 5.0–8.0)

## 2019-11-15 LAB — CBC
HCT: 44.5 % (ref 36.0–46.0)
Hemoglobin: 13.9 g/dL (ref 12.0–15.0)
MCH: 27.9 pg (ref 26.0–34.0)
MCHC: 31.2 g/dL (ref 30.0–36.0)
MCV: 89.2 fL (ref 80.0–100.0)
Platelets: 247 10*3/uL (ref 150–400)
RBC: 4.99 MIL/uL (ref 3.87–5.11)
RDW: 14.3 % (ref 11.5–15.5)
WBC: 4.5 10*3/uL (ref 4.0–10.5)
nRBC: 0 % (ref 0.0–0.2)

## 2019-11-15 LAB — COMPREHENSIVE METABOLIC PANEL
ALT: 18 U/L (ref 0–44)
AST: 18 U/L (ref 15–41)
Albumin: 4.4 g/dL (ref 3.5–5.0)
Alkaline Phosphatase: 68 U/L (ref 38–126)
Anion gap: 7 (ref 5–15)
BUN: 11 mg/dL (ref 8–23)
CO2: 26 mmol/L (ref 22–32)
Calcium: 9.2 mg/dL (ref 8.9–10.3)
Chloride: 107 mmol/L (ref 98–111)
Creatinine, Ser: 0.72 mg/dL (ref 0.44–1.00)
GFR calc Af Amer: >60 mL/min (ref >60)
GFR calc non Af Amer: >60 mL/min (ref >60)
Glucose, Bld: 89 mg/dL (ref 70–99)
Potassium: 4 mmol/L (ref 3.5–5.1)
Sodium: 140 mmol/L (ref 135–145)
Total Bilirubin: 0.6 mg/dL (ref 0.3–1.2)
Total Protein: 7.7 g/dL (ref 6.5–8.1)

## 2019-11-15 LAB — LIPASE, BLOOD: Lipase: 19 U/L (ref 11–51)

## 2019-11-15 NOTE — ED Provider Notes (Signed)
[facility] DEPT Provider Note   CSN: [id number] Arrival date & time: 11/15/19  1422     History Chief Complaint  Patient presents with  . Abdominal Pain    Alexandra White is a 64 y.o. female.  HPI She presents for abdominal pain, in the left upper quadrant and lower chest associated with nausea for 2 days.  She states she is constipated frequently.  She denies vomiting but has not been hungry the last couple of days.  There has been no fever, chills, dysuria, urinary frequency, back pain or difficulty walking.  She works as a Freight forwarder at [employer].  There are no other known modifying factors.    Past Medical History:  Diagnosis Date  . HTN (hypertension) 09/20/2009   Qualifier: Diagnosis of  By: Buelah Manis MD, Lonell Grandchild      Patient Active Problem List   Diagnosis Date Noted  . Insomnia 07/16/2019  . Fatigue 07/16/2019  . Pre-diabetes 07/16/2019  . Cancer screening 03/11/2019  . Healthcare maintenance 08/23/2015  . Obesity 09/08/2012  . ASTHMA, INTERMITTENT 04/23/2010  . HLD (hyperlipidemia) 02/24/2008    History reviewed. No pertinent surgical history.   OB History   No obstetric history on file.     No family history on file.  Social History   Tobacco Use  . Smoking status: Passive Smoke Exposure - Never Smoker  . Smokeless tobacco: Never Used  Substance Use Topics  . Alcohol use: No  . Drug use: Not on file    Home Medications Prior to Admission medications   Not on File    Allergies    Penicillins  Review of Systems   Review of Systems  All other systems reviewed and are negative.   Physical Exam Updated Vital Signs BP (!) 151/74   Pulse 70   Temp (!) 97.5 F (36.4 C) (Oral)   Resp 18   LMP 09/19/2010   SpO2 99%   Physical Exam Vitals and nursing note reviewed.  Constitutional:      General: She is not in acute distress.    Appearance: She is well-developed. She is not ill-appearing, toxic-appearing  or diaphoretic.  HENT:     Head: Normocephalic and atraumatic.     Right Ear: External ear normal.     Left Ear: External ear normal.  Eyes:     Conjunctiva/sclera: Conjunctivae normal.     Pupils: Pupils are equal, round, and reactive to light.  Neck:     Trachea: Phonation normal.  Cardiovascular:     Rate and Rhythm: Normal rate and regular rhythm.     Heart sounds: Normal heart sounds.  Pulmonary:     Effort: Pulmonary effort is normal.     Breath sounds: Normal breath sounds.  Chest:     Chest wall: Tenderness (Left anterior lower chest, without crepitation, mild) present.  Abdominal:     General: There is no distension.     Palpations: Abdomen is soft.     Tenderness: There is no abdominal tenderness (Left upper quadrant, mild).  Musculoskeletal:        General: Normal range of motion.     Cervical back: Normal range of motion and neck supple.  Skin:    General: Skin is warm and dry.  Neurological:     Mental Status: She is alert and oriented to person, place, and time.     Cranial Nerves: No cranial nerve deficit.     Sensory: No sensory deficit.  Motor: No abnormal muscle tone.     Coordination: Coordination normal.  Psychiatric:        Mood and Affect: Mood normal.        Behavior: Behavior normal.        Thought Content: Thought content normal.        Judgment: Judgment normal.     ED Results / Procedures / Treatments   Labs (all labs ordered are listed, but only abnormal results are displayed) Labs Reviewed  URINALYSIS, ROUTINE W REFLEX MICROSCOPIC - Abnormal; Notable for the following components:      Result Value   APPearance HAZY (*)    Ketones, ur 20 (*)    Leukocytes,Ua LARGE (*)    WBC, UA >50 (*)    Bacteria, UA RARE (*)    All other components within normal limits  LIPASE, BLOOD  COMPREHENSIVE METABOLIC PANEL  CBC    EKG None  Radiology No results found.  Procedures Procedures (including critical care time)  Medications Ordered  in ED Medications - No data to display  ED Course  I have reviewed the triage vital signs and the nursing notes.  Pertinent labs & imaging results that were available during my care of the patient were reviewed by me and considered in my medical decision making (see chart for details).  Clinical Course as of Nov 15 2107  Mon Nov 15, 2019  2107 Normal  Comprehensive metabolic panel [EW]  [id number] Normal  CBC [EW]  2108 Normal  Lipase, blood [EW]  2108 Normal except presence of ketones, leukocytes, white cells, rare bacteria  Urinalysis, Routine w reflex microscopic(!) [EW]    Clinical Course User Index [EW] Daleen Bo, MD   MDM Rules/Calculators/A&P                       Patient Vitals for the past 24 hrs:  BP Temp Temp src Pulse Resp SpO2  11/15/19 2028 (!) 151/74 -- -- 70 18 99 %  11/15/19 1452 (!) 167/103 (!) 97.5 F (36.4 C) Oral 76 18 100 %    9:07 PM Reevaluation with update and discussion. After initial assessment and treatment, an updated evaluation reveals she is comfortable and has no further complaints.  Findings discussed and questions answered. Daleen Bo   Medical Decision Making:  This patient is presenting for evaluation of upper abdominal pain, which does require a range of treatment options, and is a complaint that involves a moderate risk of morbidity and mortality. The differential diagnoses include, gastritis, duodenitis, constipation, UTI. I decided  to review old records, and in summary generally healthy patient without prior history of intra-abdominal medical illness.  No prior abdominal surgery. I did not require additional historical information from anyone. Clinical Laboratory Tests Ordered, included CBC, lipase, urinalysis, metabolic panel.  Labs reviewed and are reassuring.  Also somewhat abnormal however not diagnostic for UTI.  Radiologic Tests Ordered, included none.   Critical Interventions-clinical evaluation, laboratory testing,  observation  After These Interventions, the Patient was reevaluated and was found stable for discharge with suspected constipation as source for her discomfort.  Mildly abnormal urinalysis however this is clean-catch sample, with increased gross epithelialization does not have urinary tract symptoms.  CRITICAL CARE-no Performed by: Daleen Bo  Nursing Notes Reviewed/ Care Coordinated Applicable Imaging Reviewed Interpretation of Laboratory Data incorporated into ED treatment  The patient appears reasonably screened and/or stabilized for discharge and I doubt any other medical condition or other [facility] requiring further  screening, evaluation, or treatment in the ED at this time prior to discharge.  Plan: Home Medications-treatment for constipation with magnesium citrate and Colace, routine OTC analgesia; Home Treatments-increase fiber in diet; return here if the recommended treatment, does not improve the symptoms; Recommended follow up-PCP follow-up as needed   Final Clinical Impression(s) / ED Diagnoses Final diagnoses:  Constipation, unspecified constipation type    Rx / DC Orders ED Discharge Orders    None       Daleen Bo, MD 11/16/19 1445

## 2019-11-15 NOTE — ED Triage Notes (Signed)
Per pt, LUQ pain and nausea for 2 days-states she is normally healthy and never get sick

## 2019-11-15 NOTE — ED Notes (Signed)
An After Visit Summary was printed and given to the patient. Discharge instructions given and no further questions at this time.  

## 2019-11-15 NOTE — Discharge Instructions (Addendum)
To treat constipation, increase the fiber in your diet, and drink a lot of water.  Also, use a bottle of magnesium citrate, followed by Colace 100 mg twice a day for a month.  For pain use Tylenol, and try using a heating pad or sit in a tub to help your discomfort.

## 2019-11-16 ENCOUNTER — Ambulatory Visit: Payer: 59

## 2019-11-16 ENCOUNTER — Ambulatory Visit: Payer: 59 | Admitting: Student in an Organized Health Care Education/Training Program

## 2019-11-18 ENCOUNTER — Encounter: Payer: Self-pay | Admitting: Family Medicine

## 2019-11-18 ENCOUNTER — Ambulatory Visit (INDEPENDENT_AMBULATORY_CARE_PROVIDER_SITE_OTHER): Payer: 59 | Admitting: Family Medicine

## 2019-11-18 ENCOUNTER — Encounter: Payer: Self-pay | Admitting: Nurse Practitioner

## 2019-11-18 ENCOUNTER — Other Ambulatory Visit: Payer: Self-pay

## 2019-11-18 VITALS — BP 148/64 | HR 66 | Ht 67.0 in | Wt 204.0 lb

## 2019-11-18 DIAGNOSIS — R1013 Epigastric pain: Secondary | ICD-10-CM | POA: Insufficient documentation

## 2019-11-18 MED ORDER — FAMOTIDINE 20 MG PO TABS: 20.0000 mg | ORAL_TABLET | Freq: Two times a day (BID) | ORAL | 0 refills | Status: DC

## 2019-11-18 NOTE — Assessment & Plan Note (Signed)
Still having abdominal pain despite having bowel movements after being seen at [facility] long ED.  At that time, CBC, CMP, lipase were all within normal limits.  UA showed 20 ketones, large leuks, greater than 50 WBCs, rare bacteria, not sent for culture.  Patient endorsed some urinary symptoms last week, but states that these resolved after she stopped drinking caffeine before going to bed.  Denies any urinary symptoms at present.  No fevers.  No CVA tenderness.  Cystitis and pyelonephritis much less likely.  Her lung exam is benign, therefore do not think pneumonia is contributing, also breathing comfortably and no fever or white blood cell count on labs.  Given that her pain is mostly in the epigastrium and she reports some burning that moves up into her chest, do think that there may be a component of GERD.  It does not seem that she has a formal diagnosis of GERD previously, so we will treat this is new onset.  We will start her on famotidine 20 mg twice daily and urgent referral placed to gastroenterology.  She also needs a colonoscopy, therefore this was added to the order.  Do not think that she needs any imaging at this time given that she does not have an acute abdomen on examination, she overall is not in any distress, does not seem to have distention.  Advised her to stop taking ibuprofen as this can worsen her abdominal pain.  Return precautions discussed with patient including worsening pain, fever, inability to tolerate p.o., blood in stool, dysuria or hematuria.  She voiced understanding.   -Pepcid 20 mg twice daily -GI referral for new onset GERD and need for colonoscopy

## 2019-11-18 NOTE — Progress Notes (Signed)
SUBJECTIVE:   CHIEF COMPLAINT / HPI:   Abdominal Pain Started 4/25 after eating breakfast, ate eggs and decaf coffee, nausea after 30 minutes Was seen at [facility] long ED on 4/26 for the same UA at that time with 20 ketones, large leuks, greater than 50 WBCs, rare bacteria, does not seem to have been sent for culture. CBC was within normal limits, CMP and lipase also within normal limits. She was told to et magnesium citrate, had BM twice, no hemtochezia or melena Pain is mostly in the LUQ under ribs, but thinks that it also also all over, pointing to the RUQ and epigastrum as well Reports soreness over left lower ribs No change in activity Also has nausea Hasn't been eating because she feels like she is going to through up Also having some burning that comes up into her chest This AM when getting out of bed, she felt a "cramp" in the left mid-back Last week she thought she was peeing a lot more, but not having this now  Was drinking caffeine late at that time No dysuria, itching, burning, hematuria She is not on any pills, took ibuprofen today, but doesn't "take them a lot" No fevers, vomiting Eating makes nausea worse, but not the pain Reports history of "indigestion" and doesn't think this is the same Has chronic constipation   PERTINENT  PMH / PSH: Prediabetes, HLD, asthma, obesity  OBJECTIVE:   BP (!) 148/64   Pulse 66   Ht 5\' 7"  (1.702 m)   Wt 204 lb (92.5 kg)   LMP 09/19/2010   SpO2 99%   BMI 31.95 kg/m    Physical Exam:  General: 64 y.o. female in NAD Cardio: RRR no m/r/g Lungs: CTAB, no wheezing, no rhonchi, no crackles, no IWOB on RA Chest: mild TTP over left anterior lower ribs  Abdomen: Soft, diffusely mildly tender to palpation, worse in epigastrium, specifically no tenderness to palpation suprapubically, non-distended, positive bowel sounds, no CVA tenderness b/l Skin: warm and dry Extremities: No edema   ASSESSMENT/PLAN:   Epigastric abdominal  pain Still having abdominal pain despite having bowel movements after being seen at [facility] long ED.  At that time, CBC, CMP, lipase were all within normal limits.  UA showed 20 ketones, large leuks, greater than 50 WBCs, rare bacteria, not sent for culture.  Patient endorsed some urinary symptoms last week, but states that these resolved after she stopped drinking caffeine before going to bed.  Denies any urinary symptoms at present.  No fevers.  No CVA tenderness.  Cystitis and pyelonephritis much less likely.  Her lung exam is benign, therefore do not think pneumonia is contributing, also breathing comfortably and no fever or white blood cell count on labs.  Given that her pain is mostly in the epigastrium and she reports some burning that moves up into her chest, do think that there may be a component of GERD.  It does not seem that she has a formal diagnosis of GERD previously, so we will treat this is new onset.  We will start her on famotidine 20 mg twice daily and urgent referral placed to gastroenterology.  She also needs a colonoscopy, therefore this was added to the order.  Do not think that she needs any imaging at this time given that she does not have an acute abdomen on examination, she overall is not in any distress, does not seem to have distention.  Advised her to stop taking ibuprofen as this can worsen  her abdominal pain.  Return precautions discussed with patient including worsening pain, fever, inability to tolerate p.o., blood in stool, dysuria or hematuria.  She voiced understanding.   -Pepcid 20 mg twice daily -GI referral for new onset GERD and need for colonoscopy     Cleophas Dunker, [place]

## 2019-11-18 NOTE — Patient Instructions (Signed)
Thank you for coming to see me today. It was a pleasure. Today we talked about:   Your stomach pain: I have referred you to the stomach doctor for further evaluation.  If you do not hear from them within 1 week, please let us know.  I have also sent Pepcid which you should take twice daily every day.  If you have any worsening of your pain, you are not able to eat anything, you start having blood in your stool, if you start having pain with urination, or fevers, you should be seen right away.  Please follow-up with your PCP in 1 month or sooner as needed.  If you have any questions or concerns, please do not hesitate to call the office at [phone].  Best,   [place] Constable, DO

## 2019-11-29 ENCOUNTER — Ambulatory Visit: Payer: 59 | Admitting: Nurse Practitioner

## 2019-11-30 NOTE — Progress Notes (Signed)
11/30/2019 Taleea Dinatale [id number] 22-Sep-1955   CHIEF COMPLAINT: Nausea, upper abdominal pain and constipation  HISTORY OF PRESENT ILLNESS:  Alexandra White is a 64 year old female with a past medical history of asthma, hypertension, hyperlipidemia, possible glaucoma, kidney stones age 59 and obesity. She presents to our office today as referred by her PCP Dr. Bernita Raisin Meccariello for further evaluation regarding nausea, upper abdominal pain and to schedule a screening.  Colonoscopy.  On Sunday, 11/14/2019 she ate hash browns, eggs and toast and drink coffee.  Shortly after, she developed abrupt onset of nausea with left upper quadrant abdominal pain.  She described the pain as a cramp-like discomfort which felt tighter and tighter.  Her left upper quadrant pain persisted and radiated across to her right upper quadrant.  No dysphagia or heartburn. No bowel movement for 3 days.  The next day, her upper abdominal pain persisted therefore she presented to [facility] ED for further evaluation.  CBC, CMP and lipase levels were normal.  A urinalysis showed moderate white blood cells.  Increased urinary frequency 1 week ago. She was prescribed Magnesium citrate for her constipation and she was  discharged home.  She was seen by her PCP 11/18/2019 for follow up.  She continued to have upper abdominal pain her nausea was less.  She was prescribed Famotidine 20mg  po bid. currently, she is slightly nauseated but continues to have upper abdominal pain which radiates across her right and left rib cage area.  Her pain sometimes radiates to the upper back.  No vomiting.  Eating increases her nausea at time. Eating does not worsen her upper abdominal pain.  No history of gallstones.  She has increased her water intake from 4 glasses to 8 glasses daily.  She has increased her dietary fiber but remains constipated.  She is passing a small formed stool every other day.  She can go 3 days without passing a BM.  No  rectal bleeding or black stools.  No fever, sweats or chills.  No weight loss. She has gained a few pounds over the past few weeks which is bothersome to her.  Infrequently takes NSAIDs. Sister died at the age of 69 secondary to pancreatic cancer.  Family history of colorectal cancer.  Social history: She is married.  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  Remote cigarette smoking in her 20's. No alcohol or drug use.  Previously drank 4 cups of coffee daily.  No coffee for the past 2 weeks.  Family History: Mother died 19 PE after knee surgery. Father died age 69 MI. Sister with pancreatic cancer age 30. Sister  66 with  History of a stroke.  Sister age 23 heart murmur.    Allergies  Allergen Reactions   Penicillins     REACTION: Hives/ Nausea and vomiting     Outpatient Encounter Medications as of 12/01/2019  Medication Sig   famotidine (PEPCID) 20 MG tablet Take 1 tablet (20 mg total) by mouth 2 (two) times daily.   No facility-administered encounter medications on file as of 12/01/2019.    REVIEW OF SYSTEMS:  Gen: Denies fever, sweats or chills. No weight loss.  CV: + heart murmur. Sometimes has Denies chest pain, palpitations or edema. Resp: Denies cough, shortness of breath of hemoptysis.  GI: Denies heartburn, dysphagia, stomach or lower abdominal pain. No diarrhea or constipation.  GU : Denies urinary burning, blood in urine, increased urinary frequency or incontinence. MS: + back pain. Derm:  Denies rash, itchiness, skin lesions or unhealing ulcers. Psych: Denies depression, anxiety, memory loss, suicidal ideation and confusion. Heme: Denies bruising, bleeding. Neuro:  + vision changes, headaches. Denies dizziness or paresthesias. Insomnia.  Endo:  Denies any problems with DM, thyroid or adrenal function.   PHYSICAL EXAM: LMP 09/19/2010  General: Well developed 64 year old female in no acute distress. Head: Normocephalic and atraumatic. Eyes:  Sclerae non-icteric,  conjunctive pink. Ears: Normal auditory acuity. Mouth: Few missing dentition.  No ulcers or lesions.  Neck: Supple, no lymphadenopathy or thyromegaly.  Lungs: Clear bilaterally to auscultation without wheezes, crackles or rhonchi. Heart: Regular rate and rhythm. Soft systolic murmur. No rub or gallop appreciated.  Abdomen: Soft, non distended. Mild epigastric, LUQ  and RLQ tenderness without rebound or guarding. No masses. No hepatosplenomegaly. Normoactive bowel sounds x 4 quadrants.  Rectal: Deferred.  Musculoskeletal: Symmetrical with no gross deformities. Skin: Warm and dry. No rash or lesions on visible extremities. Extremities: No edema. Neurological: Alert oriented x 4, no focal deficits.  Psychological:  Alert and cooperative. Normal mood and affect.  ASSESSMENT AND PLAN:  45. 64 year old female with nausea and upper abdominal pain. -Abdominal  ultrasound to evaluate the liver and gallbladder -EGD benefits and risks discussed including risk with sedation, risk of bleeding, perforation and infection  -If the above evaluation negative, consider abd/pelvic CT with contrast to further evaluate the pancrease  -Continue Famotidine 20 mg p.o. twice daily for now -Dicyclomine 10 mg 1 p.o. every 8 hours as needed for abdominal cramping pain -To call her office if her symptoms worsen -Repeat UA with cx  2. Constipation -MiraLAX 1 capful mixed in 8 ounces water at bedtime. -If no improvement on MiraLAX will send a prescription for Linzess 145 mcg 1 p.o. daily  3.  Colon cancer screen -Colonoscopy benefits and risks discussed including risk with sedation, risk of bleeding, perforation and infection   4.  Family history of pancreatic cancer -See  Plan in # 1          CC:  Anderson, Chelsey L, DO

## 2019-12-01 ENCOUNTER — Encounter: Payer: Self-pay | Admitting: Nurse Practitioner

## 2019-12-01 ENCOUNTER — Other Ambulatory Visit (INDEPENDENT_AMBULATORY_CARE_PROVIDER_SITE_OTHER): Payer: 59

## 2019-12-01 ENCOUNTER — Ambulatory Visit: Payer: 59 | Admitting: Nurse Practitioner

## 2019-12-01 VITALS — BP 142/86 | HR 72 | Temp 97.8°F | Ht 67.0 in | Wt 206.4 lb

## 2019-12-01 DIAGNOSIS — N390 Urinary tract infection, site not specified: Secondary | ICD-10-CM

## 2019-12-01 DIAGNOSIS — R1010 Upper abdominal pain, unspecified: Secondary | ICD-10-CM

## 2019-12-01 DIAGNOSIS — Z1211 Encounter for screening for malignant neoplasm of colon: Secondary | ICD-10-CM

## 2019-12-01 DIAGNOSIS — K5900 Constipation, unspecified: Secondary | ICD-10-CM | POA: Diagnosis not present

## 2019-12-01 LAB — URINALYSIS, ROUTINE W REFLEX MICROSCOPIC
Bilirubin Urine: NEGATIVE
Ketones, ur: NEGATIVE
Nitrite: NEGATIVE
Specific Gravity, Urine: 1.02 (ref 1.000–1.030)
Total Protein, Urine: NEGATIVE
Urine Glucose: NEGATIVE
Urobilinogen, UA: 0.2 (ref 0.0–1.0)
pH: 6 (ref 5.0–8.0)

## 2019-12-01 MED ORDER — NA SULFATE-K SULFATE-MG SULF 17.5-3.13-1.6 GM/177ML PO SOLN: 1.0000 | Freq: Once | ORAL | 0 refills | Status: AC

## 2019-12-01 MED ORDER — DICYCLOMINE HCL 10 MG PO CAPS: 10.0000 mg | ORAL_CAPSULE | Freq: Three times a day (TID) | ORAL | 0 refills | Status: DC | PRN

## 2019-12-01 NOTE — Patient Instructions (Signed)
If you are age 64 or older, your body mass index should be between 23-30. Your Body mass index is 32.32 kg/m. If this is out of the aforementioned range listed, please consider follow up with your Primary Care Provider.  If you are age 18 or younger, your body mass index should be between 19-25. Your Body mass index is 32.32 kg/m. If this is out of the aformentioned range listed, please consider follow up with your Primary Care Provider.   Your provider has requested that you go to the basement level for lab work before leaving today. Press "B" on the elevator. The lab is located at the first door on the left as you exit the elevator.  Take Miralax 1 capful mixed in 8 ounces of water at bed time for constipation as tolerated.  If you have no improvement on Miralax we will try Linzess.  Continue taking your famotidine 20mg  twice daily  We have sent the following medications to your pharmacy for you to pick up at your convenience:  Dicyclomine 10 mg 1 tabled every 8 hours for abdominal pain suprep  Due to recent changes in healthcare laws, you may see the results of your imaging and laboratory studies on MyChart before your provider has had a chance to review them.  We understand that in some cases there may be results that are confusing or concerning to you. Not all laboratory results come back in the same time frame and the provider may be waiting for multiple results in order to interpret others.  Please give [place] 48 hours in order for your provider to thoroughly review all the results before contacting the office for clarification of your results.   Thank you for choosing [facility] Noralyn Pick, CRNP

## 2019-12-01 NOTE — Progress Notes (Signed)
Reviewed and agree with management plans. ? ?Terrick Allred L. Timo Hartwig, MD, MPH  ?

## 2019-12-02 LAB — URINE CULTURE
MICRO NUMBER:: 10469220
SPECIMEN QUALITY:: ADEQUATE

## 2019-12-03 ENCOUNTER — Other Ambulatory Visit: Payer: Self-pay

## 2019-12-03 ENCOUNTER — Ambulatory Visit (HOSPITAL_COMMUNITY)
Admission: RE | Admit: 2019-12-03 | Discharge: 2019-12-03 | Disposition: A | Discharge status: Home / Self Care | Payer: 59 | Source: Ambulatory Visit | Attending: Nurse Practitioner | Admitting: Nurse Practitioner

## 2019-12-03 ENCOUNTER — Other Ambulatory Visit: Payer: Self-pay | Admitting: Nurse Practitioner

## 2019-12-03 DIAGNOSIS — R1010 Upper abdominal pain, unspecified: Secondary | ICD-10-CM | POA: Diagnosis present

## 2019-12-03 DIAGNOSIS — K5900 Constipation, unspecified: Secondary | ICD-10-CM | POA: Insufficient documentation

## 2019-12-07 ENCOUNTER — Telehealth: Payer: Self-pay | Admitting: General Surgery

## 2019-12-07 ENCOUNTER — Telehealth: Payer: Self-pay | Admitting: Nurse Practitioner

## 2019-12-07 NOTE — Telephone Encounter (Signed)
-----   Message from Noralyn Pick, NP sent at 12/04/2019  6:41 AM EDT ----- Olivia Mackie, pls inform pt her urine culture was negative. Thx.

## 2019-12-07 NOTE — Telephone Encounter (Signed)
Called the patient to advise her that her UA was negative. Left a detailed message and told patient to return call if needed.

## 2019-12-07 NOTE — Telephone Encounter (Signed)
I have called the patient about her abdominal u/s results. Please send her call to me. Thanks

## 2019-12-27 ENCOUNTER — Encounter (HOSPITAL_COMMUNITY): Payer: Self-pay | Admitting: Surgery

## 2019-12-27 ENCOUNTER — Ambulatory Visit (HOSPITAL_COMMUNITY): Payer: Self-pay | Admitting: Surgery

## 2019-12-27 NOTE — H&P (Signed)
Alexandra White Appointment: 12/27/2019 11:00 AM Location: [facility] Patient #: [id number] DOB: 10/27/1955 Married / Language: English / Race: Black or African American Female  History of Present Illness Alexandra Hector MD; 12/27/2019 12:10 PM) The patient is a 64 year old female who presents with abdominal pain. Note for "Abdominal pain": ` ` ` Patient sent for surgical consultation at the request of Dr Laurier Nancy  Chief Complaint: Upper abdominal pain with gallstones. ` ` The patient is a 64 year old woman. An episode of severe upper abdominal pain after eating. Seen to be more in the left upper abdomen. Related to the right side. Some nausea. Persisted for a few days. Went to the emergency room. Cardiopulmonary etiology ruled out. Thought perhaps to be more related to GERD/forgut. Placed on famotidine. Followed up with gastrology a few weeks later. EGD planned. Story a little more suspicious for biliary colic since she's had re-triggering with eating. Ultrasound showed gallstones. Surgical consultation offered.  Patient has a history of pancreatic cancer and a sister when she was in her 12s. Patient think she's had some attacks earlier this year. Said some discomfort radiating to her right shoulder. Seen to be triggered with fatty meals. Woke her up. Had episode of some epigastric tightness and nausea. Worst attack was after having a big steak meal. Felt like she was also going to throw up. Persisted. That triggered the ER visit. She tends to be constipated, moving her bowels every 2-3 days. Since she's been getting these attacks she needs to take a laxative to move anything. Walk at least half hour without difficulty. She does not smoke. Not diabetic. No history of cardiac or pulmonary issues. She had a C-section but no other abdominal surgeries.  She's tried to adjust her diet to be much more low flat bland. Still gets some milder attacks. She wondered  if caffeine was issue since she has some urinary frequency and changes. Negative urinalysis. Stop coffee. Feels more tired but still is getting attacks. No personal nor family history of colon cancer, inflammatory bowel disease, irritable bowel syndrome, allergy such as Celiac Sprue, dietary/dairy problems, colitis, ulcers nor gastritis. No recent sick contacts/gastroenteritis. No travel outside the country. No changes in diet. No dysphagia to solids or liquids. No significant heartburn or reflux. No melena, hematemesis, coffee ground emesis. No evidence of prior gastric/peptic ulceration.  (Review of systems as stated in this history (HPI) or in the review of systems. Otherwise all other 12 point ROS are negative) ` ` ` Study Result  CLINICAL DATA: 64 year old female with right upper quadrant abdominal pain. EXAM: ULTRASOUND ABDOMEN LIMITED RIGHT UPPER QUADRANT COMPARISON: None. FINDINGS: Gallbladder: Numerous echogenic gallstones with shadowing (image 13). Individual stones estimated up to 14 mm diameter. The gallbladder is partially contracted. Stones are noted in the gallbladder neck. But no wall thickening or pericholecystic fluid identified. Mild tenderness with scanning but no sonographic Murphy sign elicited. Common bile duct: Diameter: Up to 5 mm, normal. Liver: No focal lesion identified. Within normal limits in parenchymal echogenicity. Portal vein is patent on color Doppler imaging with normal direction of blood flow towards the liver. Other: Negative visible right kidney. IMPRESSION: Extensive Cholelithiasis, but no evidence of acute cholecystitis or bile duct obstruction. Electronically Signed By: [name] On: 12/03/2019 16:01      This patient encounter took 40 minutes today to perform the following: obtain history, perform exam, review outside records, interpret tests & imaging, counsel the patient on their diagnosis; and, document this  encounter, including findings & plan in the electronic health record (EHR).   Past Surgical History Alexandra Hector, MD; 12/27/2019 11:36 AM) No pertinent past surgical history CESAREAN SECTION, PRIMARY (17510)  Diagnostic Studies History ([name], [name]; 12/27/2019 11:20 AM) Colonoscopy never Mammogram 1-3 years ago Pap Smear 1-5 years ago  Allergies ([name], [name]; 12/27/2019 11:21 AM) Penicillins Amoxicillin *PENICILLINS* Allergies Reconciled  Medication History ([name], [name]; 12/27/2019 11:22 AM) Dicyclomine HCl (10MG  Capsule, Oral) Active. Famotidine (20MG  Tablet, Oral) Active. Medications Reconciled  Social History [name], [name]; 12/27/2019 11:20 AM) Alcohol use Moderate alcohol use. Caffeine use Coffee, Tea. No drug use  Family History [name], [name]; 12/27/2019 11:20 AM) Cerebrovascular Accident Sister. Depression Sister. Diabetes Mellitus Sister. Heart Disease Mother. Heart disease in female family member before age 30 Hypertension Brother, Mother. Ovarian Cancer Sister. Thyroid problems Mother, Sister.  Pregnancy / Birth History [name], [name]; 12/27/2019 11:20 AM) Age at menarche 88 years. Age of menopause >61 Contraceptive History Oral contraceptives. Gravida 2 Maternal age 39-20 Para 2  Other Problems [name], [name]; 12/27/2019 11:20 AM) Kidney [name]     Review of Systems (Chanel Nolan [name]; 12/27/2019 11:20 AM) General Present- Appetite Loss, Chills, Fatigue and Weight Gain. Not Present- Fever, Night Sweats and Weight Loss. Skin Not Present- Change in Wart/Mole, Dryness, Hives, Jaundice, New Lesions, Non-Healing Wounds, Rash and Ulcer. Respiratory Not Present- Bloody sputum, Chronic Cough, Difficulty Breathing, Snoring and Wheezing. Cardiovascular Not Present- Chest Pain, Difficulty Breathing Lying Down, Leg Cramps, Palpitations, Rapid Heart Rate, Shortness of Breath and Swelling of  Extremities. Gastrointestinal Present- Abdominal Pain, Bloating, Change in Bowel Habits, Constipation, Gets full quickly at meals and Indigestion. Not Present- Bloody Stool, Chronic diarrhea, Difficulty Swallowing, Excessive gas, Hemorrhoids, Nausea, Rectal Pain and Vomiting. Female Genitourinary Not Present- Frequency, Nocturia, Painful Urination, Pelvic Pain and Urgency. Musculoskeletal Not Present- Back Pain, Joint Pain, Joint Stiffness, Muscle Pain, Muscle Weakness and Swelling of Extremities. Neurological Not Present- Decreased Memory, Fainting, Headaches, Numbness, Seizures, Tingling, Tremor, Trouble walking and Weakness. Psychiatric Not Present- Anxiety, Bipolar, Change in Sleep Pattern, Depression, Fearful and Frequent crying. Endocrine Not Present- Cold Intolerance, Excessive Hunger, Hair Changes, Heat Intolerance, Hot flashes and New Diabetes. Hematology Not Present- Blood Thinners, Easy Bruising, Excessive bleeding, Gland problems, HIV and Persistent Infections.  Vitals (Chanel Nolan [name]; 12/27/2019 11:22 AM) 12/27/2019 11:22 AM Weight: 198.5 lb Height: 67in Body Surface Area: 2.02 m Body Mass Index: 31.09 kg/m  Temp.: 98.28F  Pulse: 74 (Regular)  BP: 124/84(Sitting, Left Arm, Standard)        Physical Exam Alexandra Hector MD; 12/27/2019 12:08 PM)  General Mental Status-Alert. General Appearance-Not in acute distress, Not Sickly. Orientation-Oriented X3. Hydration-Well hydrated. Voice-Normal. Note: Pleasant. Chatty. Not toxic. Not sickly.  Integumentary Global Assessment Upon inspection and palpation of skin surfaces of the - Axillae: non-tender, no inflammation or ulceration, no drainage. and Distribution of scalp and body hair is normal. General Characteristics Temperature - normal warmth is noted.  Head and Neck Head-normocephalic, atraumatic with no lesions or palpable masses. Face Global Assessment - atraumatic, no absence of  expression. Neck Global Assessment - no abnormal movements, no bruit auscultated on the right, no bruit auscultated on the left, no decreased range of motion, non-tender. Trachea-midline. Thyroid Gland Characteristics - non-tender.  Eye Eyeball - Left-Extraocular movements intact, No Nystagmus - Left. Eyeball - Right-Extraocular movements intact, No Nystagmus - Right. Cornea - Left-No Hazy - Left. Cornea - Right-No Hazy - Right. Sclera/Conjunctiva - Left-No scleral icterus,  No Discharge - Left. Sclera/Conjunctiva - Right-No scleral icterus, No Discharge - Right. Pupil - Left-Direct reaction to light normal. Pupil - Right-Direct reaction to light normal.  ENMT Ears Pinna - Left - no drainage observed, no generalized tenderness observed. Pinna - Right - no drainage observed, no generalized tenderness observed. Nose and Sinuses External Inspection of the Nose - no destructive lesion observed. Inspection of the nares - Left - quiet respiration. Inspection of the nares - Right - quiet respiration. Mouth and Throat Lips - Upper Lip - no fissures observed, no pallor noted. Lower Lip - no fissures observed, no pallor noted. Nasopharynx - no discharge present. Oral Cavity/Oropharynx - Tongue - no dryness observed. Oral Mucosa - no cyanosis observed. Hypopharynx - no evidence of airway distress observed.  Chest and Lung Exam Inspection Movements - Normal and Symmetrical. Accessory muscles - No use of accessory muscles in breathing. Palpation Palpation of the chest reveals - Non-tender. Auscultation Breath sounds - Normal and Clear.  Cardiovascular Auscultation Rhythm - Regular. Murmurs & Other Heart Sounds - Auscultation of the heart reveals - No Murmurs and No Systolic Clicks.  Abdomen Inspection Inspection of the abdomen reveals - No Visible peristalsis and No Abnormal pulsations. Umbilicus - No Bleeding, No Urine drainage. Palpation/Percussion Palpation and  Percussion of the abdomen reveal - Soft, Non Tender, No Rebound tenderness, No Rigidity (guarding) and No Cutaneous hyperesthesia. Note: Abdomen soft. Mild discomfort epigastric and right upper quadrant region. Much less in left upper quadrant. Umbilical & lower abdomen nontender. Low midline incision without hernia.   Not distended. No diastasis recti. No umbilical or other anterior abdominal wall hernias  Female Genitourinary Sexual Maturity Tanner 5 - Adult hair pattern. Note: No vaginal bleeding nor discharge  Peripheral Vascular Upper Extremity Inspection - Left - No Cyanotic nailbeds - Left, Not Ischemic. Inspection - Right - No Cyanotic nailbeds - Right, Not Ischemic.  Neurologic Neurologic evaluation reveals -normal attention span and ability to concentrate, able to name objects and repeat phrases. Appropriate fund of knowledge , normal sensation and normal coordination. Mental Status Affect - not angry, not paranoid. Cranial Nerves-Normal Bilaterally. Gait-Normal.  Neuropsychiatric Mental status exam performed with findings of-able to articulate well with normal speech/language, rate, volume and coherence, thought content normal with ability to perform basic computations and apply abstract reasoning and no evidence of hallucinations, delusions, obsessions or homicidal/suicidal ideation. Note: Somewhat rambling/meandering with her history, but no mania or word salad.  Musculoskeletal Global Assessment Spine, Ribs and Pelvis - no instability, subluxation or laxity. Right Upper Extremity - no instability, subluxation or laxity.  Lymphatic Head & Neck  General Head & Neck Lymphatics: Bilateral - Description - No Localized lymphadenopathy. Axillary  General Axillary Region: Bilateral - Description - No Localized lymphadenopathy. Femoral & Inguinal  Generalized Femoral & Inguinal Lymphatics: Left - Description - No Localized lymphadenopathy. Right -  Description - No Localized lymphadenopathy.    Assessment & Plan Alexandra Hector MD; 12/27/2019 11:50 AM)  CHRONIC CHOLECYSTITIS WITH CALCULUS (K80.10) Impression: Rather classic story biliary colic with epigastric pain with nausea and bloating. Some radiation to the right side and shoulder with obvious gallstones.  I think she would benefit from cholecystectomy. Laparoscopic/single site approach.  She is leaning towards it but does not seem ready to make a decision. I tried to explain had many different ways since she seemed somewhat confused. I think things are clarified by the end of the visit. I did give her some information about surgery as well.  Another option is to go and proceed with EGD to make sure there is no esophagogastric or other foregut concerns. With her episodic symptoms despite being on acid blockade, I am somewhat skeptical that they're going to find something explains everything, but it is not unreasonable to double check before proceeding with cholecystectomy first.  Current Plans You are being scheduled for surgery- Our schedulers will call you.  You should hear from our office's scheduling department within 5 working days about the location, date, and time of surgery. We try to make accommodations for patient's preferences in scheduling surgery, but sometimes the OR schedule or the surgeon's schedule prevents [place] from making those accommodations.  If you have not heard from our office [phone]) in 5 working days, call the office and ask for your surgeon's nurse.  If you have other questions about your diagnosis, plan, or surgery, call the office and ask for your surgeon's nurse.  Written instructions provided Pt Education - Pamphlet Given - Laparoscopic Gallbladder Surgery: discussed with patient and provided information. The anatomy & physiology of hepatobiliary & pancreatic function was discussed. The pathophysiology of gallbladder dysfunction was  discussed. Natural history risks without surgery was discussed. I feel the risks of no intervention will lead to serious problems that outweigh the operative risks; therefore, I recommended cholecystectomy to remove the pathology. I explained laparoscopic techniques with possible need for an open approach. Probable cholangiogram to evaluate the bilary tract was explained as well.  Risks such as bleeding, infection, abscess, leak, injury to other organs, need for further treatment, heart attack, death, and other risks were discussed. I noted a good likelihood this will help address the problem. Possibility that this will not correct all abdominal symptoms was explained. Goals of post-operative recovery were discussed as well. We will work to minimize complications. An educational handout further explaining the pathology and treatment options was given as well. Questions were answered. The patient expresses understanding & wishes to proceed with surgery.  Pt Education - [facility] Laparosopic Post Op HCI (Royer Cristobal) Pt Education - [facility] Good Bowel Health (Yamil Dougher) Pt Education - Laparoscopic Cholecystectomy: gallbladder    Alexandra Hector, MD, FACS, MASCRS Gastrointestinal and Minimally Invasive Surgery  [facility] [address], [place], [place] [address] [phone] Fax [phone] Main/Paging  CONTACT INFORMATION: Weekday (9AM-5PM) concerns: Call [facility] main office at [phone] Weeknight (5PM-9AM) or Weekend/Holiday concerns: Check [email or web address] for General Surgery [facility] coverage (Please, do not use SecureChat as it is not reliable communication to operating surgeons for immediate patient care)

## 2020-01-04 ENCOUNTER — Telehealth: Payer: Self-pay | Admitting: Nurse Practitioner

## 2020-01-04 NOTE — Telephone Encounter (Signed)
Patient called requesting to speak with nurse in reference to her upcoming procedure appt did not provide any other information

## 2020-01-04 NOTE — Telephone Encounter (Signed)
I am sorry to hear that she was having difficulties. These symptoms should be reviewed with her primary care provider to be sure that additional evaluation isn't indicated. In the meantime, she should stay well hydrated and eat to fuel her body. Thank you.

## 2020-01-04 NOTE — Telephone Encounter (Signed)
FYI - EGD/colon 01/07/20  Spoke with the patient. She reports she had a dizzy spell this morning when she was out working in her yard. She said it passed after a moment. She did not have any other symptoms including no chest pain or radiating pain, no nausea or difficulty breathing. She came into her house and checked her blood pressure. States she does not remember the exact number, but feels it may have been elevated. She resumed her activities. No further symptoms. She admits she had not eaten since last night. She had not had any fluids. Her head hurt a little. States all is better now. " I think I panicked because I got that procedure coming up and all."   Discussed the importance maintaining hydration and adequate calorie intake. No history of HTN or cardiac issues.

## 2020-01-05 ENCOUNTER — Ambulatory Visit (INDEPENDENT_AMBULATORY_CARE_PROVIDER_SITE_OTHER): Payer: 59 | Admitting: Family Medicine

## 2020-01-05 ENCOUNTER — Other Ambulatory Visit: Payer: Self-pay

## 2020-01-05 ENCOUNTER — Ambulatory Visit (HOSPITAL_COMMUNITY)
Admission: RE | Admit: 2020-01-05 | Discharge: 2020-01-05 | Disposition: A | Discharge status: Home / Self Care | Payer: 59 | Source: Ambulatory Visit | Attending: Family Medicine | Admitting: Family Medicine

## 2020-01-05 VITALS — BP 128/74 | HR 56 | Ht 67.0 in | Wt 196.0 lb

## 2020-01-05 DIAGNOSIS — R55 Syncope and collapse: Secondary | ICD-10-CM

## 2020-01-05 DIAGNOSIS — R001 Bradycardia, unspecified: Secondary | ICD-10-CM | POA: Diagnosis not present

## 2020-01-05 DIAGNOSIS — R7303 Prediabetes: Secondary | ICD-10-CM | POA: Diagnosis not present

## 2020-01-05 DIAGNOSIS — R42 Dizziness and giddiness: Secondary | ICD-10-CM | POA: Diagnosis present

## 2020-01-05 DIAGNOSIS — R030 Elevated blood-pressure reading, without diagnosis of hypertension: Secondary | ICD-10-CM

## 2020-01-05 LAB — POCT GLYCOSYLATED HEMOGLOBIN (HGB A1C): Hemoglobin A1C: 5.7 % — AB (ref 4.0–5.6)

## 2020-01-05 NOTE — Patient Instructions (Addendum)
It was so wonderful meeting you today!  If you have any further episodes of lightheadedness/dizziness, your headache worsens, any visual changes, weakness or numbness, chest pain, or any shortness of breath please make sure you go to the emergency room.  If your headache resolves and you are feeling back to normal by tomorrow, it would be reasonable to continue with your EGD/colonoscopy.   We are going to check your labs today, we will call you with results when they return.  Please make sure you buy a blood pressure cuff and start checking your blood pressure once a day and keeping a journal of this.

## 2020-01-05 NOTE — Progress Notes (Signed)
SUBJECTIVE:   CHIEF COMPLAINT / HPI: "Feeling faint spell"   Alexandra White is 64 year old female presenting discussed the following:  Pre-syncopal episode: Reports she felt faint yesterday morning while working at [employer], reports it was very hot in the building. Around 7-8 am (started working around 5am), she had a brief episode of lightheaded/dizziness and nausea with a little tunnel vision, lasting a few seconds. Denies LOC, was able to catch herself and sit down. She felt okay afterwards. Denies any associated chest pain, shortness of breath, double or blurry vision, vomiting, weakness, or numbness/tingling.  After this occurrence, she went to [facility] and checked her blood pressure which was elevated at that time (around 147/89). No known hypertension, however has been elevated in other office visits. She had not eaten much since the night prior and had not had much fluids. Has been eating less for the past week due to her known gallbladder concerns. She recently received an antibiotic and Tylenol #3 for a oral infection, but did not take either of these yesterday.    Today, she reports she feels good other than still has a little frontal headache since that time.  No recurrent lightheadedness/dizziness.  Reports HA is mild, tylenol helps.    She is very worried about this episode.  She has an upcoming EGD/colonoscopy on 6/18, nervous to do the prep.   PERTINENT  PMH / PSH: Hyperlipidemia, intermittent asthma, prediabetes, elevated BMI  OBJECTIVE:   BP 128/74   Pulse (!) 56   Ht 5\' 7"  (1.702 m)   Wt 196 lb (88.9 kg)   LMP 09/19/2010   SpO2 98%   BMI 30.70 kg/m   General: Alert, NAD HEENT: NCAT, MMM Cardiac: Bradycardic, otherwise regular rhythm without murmurs, gallops, or rubs Lungs: Clear bilaterally, no increased WOB, satting appropriately on room air Abdomen: soft Msk: Moves all extremities spontaneously  Neuro: A &O, CN 2-12 Intact.  EOMI, PERRLA.  Follows commands appropriately.  5/5  upper and lower extremity strength bilaterally.  Sensation to light touch intact. Ext: Warm, dry, 2+ distal pulses, no edema   Orthostatic vitals: Lying: BP 134/92, pulse 52 Sitting: BP 146/90, pulse 53 Standing at 0 minutes: BP 142/90, pulse 58  EKG: Sinus bradycardia without T wave changes.  PR interval appropriate.   ASSESSMENT/PLAN:   Pre-syncope Episode on 6/15.  Neurovascularly intact on exam. Presentation consistent with vasovagal episode in the setting of heat exposure, prolonged standing, and decreased oral intake in the past week.  Orthostatics WNL in the office today.  EKG sinus bradycardia, HR ranging 49-59, unclear if contributory vs resultant from recent episode.  Will obtain updated CMP, CBC, and A1c to ensure no contributing worsening glucose metabolism, electrolyte abnormality, or liver/renal dysfunction.  Encouraged appropriate fluid and oral intake.   Sinus bradycardia HR 49-59 in the office today, appropriately rises with orthostatics.  Confirmed on EKG.  Uncertain if recently symptomatic as discussed above, currently asymptomatic.  Baseline HR 70s.  Unclear etiology, may be in setting of vasovagal.  No inciting medications.  Obtaining labs as above and will get TSH.  Recommend follow-up early next week to follow-up bradycardia, if continually present and symptomatic, could consider Holter monitor.  Blood pressure elevated without history of HTN BP WNL today, however has been elevated in several previous office visits.  Discussed obtaining blood pressure cuff at home and monitoring BP with keeping journal.  Goal < 130/80.  Bring journal on follow-up visit.    Follow-up next week for above or  sooner if recurrence of episode including any lightheadedness/dizziness, worsening headache, chest pain, or shortness of breath. Case discussed with Dr. Owens Shark.   Alexandra White, [place]

## 2020-01-05 NOTE — Telephone Encounter (Signed)
Recommendations given to the patient.

## 2020-01-06 ENCOUNTER — Encounter: Payer: Self-pay | Admitting: Family Medicine

## 2020-01-06 DIAGNOSIS — R030 Elevated blood-pressure reading, without diagnosis of hypertension: Secondary | ICD-10-CM | POA: Insufficient documentation

## 2020-01-06 DIAGNOSIS — R001 Bradycardia, unspecified: Secondary | ICD-10-CM | POA: Insufficient documentation

## 2020-01-06 DIAGNOSIS — R55 Syncope and collapse: Secondary | ICD-10-CM | POA: Insufficient documentation

## 2020-01-06 LAB — COMPREHENSIVE METABOLIC PANEL
ALT: 12 IU/L (ref 0–32)
AST: 15 IU/L (ref 0–40)
Albumin/Globulin Ratio: 1.8 (ref 1.2–2.2)
Albumin: 4.4 g/dL (ref 3.8–4.8)
Alkaline Phosphatase: 74 IU/L (ref 48–121)
BUN/Creatinine Ratio: 14 (ref 12–28)
BUN: 10 mg/dL (ref 8–27)
Bilirubin Total: 0.2 mg/dL (ref 0.0–1.2)
CO2: 22 mmol/L (ref 20–29)
Calcium: 9.1 mg/dL (ref 8.7–10.3)
Chloride: 105 mmol/L (ref 96–106)
Creatinine, Ser: 0.7 mg/dL (ref 0.57–1.00)
GFR calc Af Amer: 107 mL/min/{1.73_m2} (ref >59)
GFR calc non Af Amer: 93 mL/min/{1.73_m2} (ref >59)
Globulin, Total: 2.4 g/dL (ref 1.5–4.5)
Glucose: 97 mg/dL (ref 65–99)
Potassium: 4 mmol/L (ref 3.5–5.2)
Sodium: 140 mmol/L (ref 134–144)
Total Protein: 6.8 g/dL (ref 6.0–8.5)

## 2020-01-06 LAB — CBC
Hematocrit: 39.6 % (ref 34.0–46.6)
Hemoglobin: 12.7 g/dL (ref 11.1–15.9)
MCH: 27.3 pg (ref 26.6–33.0)
MCHC: 32.1 g/dL (ref 31.5–35.7)
MCV: 85 fL (ref 79–97)
Platelets: 230 10*3/uL (ref 150–450)
RBC: 4.66 x10E6/uL (ref 3.77–5.28)
RDW: 13.2 % (ref 11.7–15.4)
WBC: 3.3 10*3/uL — ABNORMAL LOW (ref 3.4–10.8)

## 2020-01-06 LAB — TSH: TSH: 1.31 u[IU]/mL (ref 0.450–4.500)

## 2020-01-06 NOTE — Assessment & Plan Note (Signed)
BP WNL today, however has been elevated in several previous office visits.  Discussed obtaining blood pressure cuff at home and monitoring BP with keeping journal.  Goal < 130/80.  Bring journal on follow-up visit.

## 2020-01-06 NOTE — Assessment & Plan Note (Addendum)
Episode on 6/15.  Neurovascularly intact on exam. Presentation consistent with vasovagal episode in the setting of heat exposure, prolonged standing, and decreased oral intake in the past week.  Orthostatics WNL in the office today.  EKG sinus bradycardia, HR ranging 49-59, unclear if contributory vs resultant from recent episode.  Will obtain updated CMP, CBC, and A1c to ensure no contributing worsening glucose metabolism, electrolyte abnormality, or liver/renal dysfunction.  Encouraged appropriate fluid and oral intake.

## 2020-01-06 NOTE — Assessment & Plan Note (Signed)
HR 49-59 in the office today, appropriately rises with orthostatics.  Confirmed on EKG.  Uncertain if recently symptomatic as discussed above, currently asymptomatic.  Baseline HR 70s.  Unclear etiology, may be in setting of vasovagal.  No inciting medications.  Obtaining labs as above and will get TSH.  Recommend follow-up early next week to follow-up bradycardia, if continually present and symptomatic, could consider Holter monitor.

## 2020-01-07 ENCOUNTER — Ambulatory Visit (AMBULATORY_SURGERY_CENTER): Payer: 59 | Admitting: Gastroenterology

## 2020-01-07 ENCOUNTER — Encounter: Payer: Self-pay | Admitting: Gastroenterology

## 2020-01-07 ENCOUNTER — Other Ambulatory Visit: Payer: Self-pay

## 2020-01-07 VITALS — BP 161/84 | HR 51 | Temp 97.7°F | Resp 18 | Ht 67.0 in | Wt 206.0 lb

## 2020-01-07 DIAGNOSIS — B9681 Helicobacter pylori [H. pylori] as the cause of diseases classified elsewhere: Secondary | ICD-10-CM | POA: Diagnosis not present

## 2020-01-07 DIAGNOSIS — K635 Polyp of colon: Secondary | ICD-10-CM

## 2020-01-07 DIAGNOSIS — K449 Diaphragmatic hernia without obstruction or gangrene: Secondary | ICD-10-CM | POA: Diagnosis not present

## 2020-01-07 DIAGNOSIS — K2980 Duodenitis without bleeding: Secondary | ICD-10-CM

## 2020-01-07 DIAGNOSIS — D122 Benign neoplasm of ascending colon: Secondary | ICD-10-CM

## 2020-01-07 DIAGNOSIS — D123 Benign neoplasm of transverse colon: Secondary | ICD-10-CM | POA: Diagnosis not present

## 2020-01-07 DIAGNOSIS — Z1211 Encounter for screening for malignant neoplasm of colon: Secondary | ICD-10-CM

## 2020-01-07 DIAGNOSIS — K2950 Unspecified chronic gastritis without bleeding: Secondary | ICD-10-CM | POA: Diagnosis not present

## 2020-01-07 DIAGNOSIS — R1010 Upper abdominal pain, unspecified: Secondary | ICD-10-CM

## 2020-01-07 DIAGNOSIS — K2970 Gastritis, unspecified, without bleeding: Secondary | ICD-10-CM | POA: Diagnosis not present

## 2020-01-07 MED ORDER — SODIUM CHLORIDE 0.9 % IV SOLN: 500.0000 mL | INTRAVENOUS | Status: DC

## 2020-01-07 NOTE — Progress Notes (Signed)
Vs CW I have reviewed the patient's medical history in detail and updated the computerized patient record.   

## 2020-01-07 NOTE — Patient Instructions (Signed)
Handouts Provided:  Polyps  YOU HAD AN ENDOSCOPIC PROCEDURE TODAY AT THE [facility]:   Refer to the procedure report that was given to you for any specific questions about what was found during the examination.  If the procedure report does not answer your questions, please call your gastroenterologist to clarify.  If you requested that your care partner not be given the details of your procedure findings, then the procedure report has been included in a sealed envelope for you to review at your convenience later.  YOU SHOULD EXPECT: Some feelings of bloating in the abdomen. Passage of more gas than usual.  Walking can help get rid of the air that was put into your GI tract during the procedure and reduce the bloating. If you had a lower endoscopy (such as a colonoscopy or flexible sigmoidoscopy) you may notice spotting of blood in your stool or on the toilet paper. If you underwent a bowel prep for your procedure, you may not have a normal bowel movement for a few days.  Please Note:  You might notice some irritation and congestion in your nose or some drainage.  This is from the oxygen used during your procedure.  There is no need for concern and it should clear up in a day or so.  SYMPTOMS TO REPORT IMMEDIATELY:  Following lower endoscopy (colonoscopy or flexible sigmoidoscopy):  Excessive amounts of blood in the stool  Significant tenderness or worsening of abdominal pains  Swelling of the abdomen that is new, acute  Fever of 100F or higher  Following upper endoscopy (EGD)  Vomiting of blood or coffee ground material  New chest pain or pain under the shoulder blades  Painful or persistently difficult swallowing  New shortness of breath  Fever of 100F or higher  Black, tarry-looking stools  For urgent or emergent issues, a gastroenterologist can be reached at any hour by calling [phone]. Do not use MyChart messaging for urgent concerns.    DIET:  We do recommend  a small meal at first, but then you may proceed to your regular diet.  Drink plenty of fluids but you should avoid alcoholic beverages for 24 hours.  ACTIVITY:  You should plan to take it easy for the rest of today and you should NOT DRIVE or use heavy machinery until tomorrow (because of the sedation medicines used during the test).    FOLLOW UP: Our staff will call the number listed on your records 48-72 hours following your procedure to check on you and address any questions or concerns that you may have regarding the information given to you following your procedure. If we do not reach you, we will leave a message.  We will attempt to reach you two times.  During this call, we will ask if you have developed any symptoms of COVID 19. If you develop any symptoms (ie: fever, flu-like symptoms, shortness of breath, cough etc.) before then, please call [phone].  If you test positive for Covid 19 in the 2 weeks post procedure, please call and report this information to us.    If any biopsies were taken you will be contacted by phone or by letter within the next 1-3 weeks.  Please call us at [phone] if you have not heard about the biopsies in 3 weeks.    SIGNATURES/CONFIDENTIALITY: You and/or your care partner have signed paperwork which will be entered into your electronic medical record.  These signatures attest to the fact that that the   that the information above on your After Visit Summary has been reviewed and is understood.  Full responsibility of the confidentiality of this discharge information lies with you and/or your care-partner.

## 2020-01-07 NOTE — Progress Notes (Signed)
Called to room to assist during endoscopic procedure.  Patient ID and intended procedure confirmed with present staff. Received instructions for my participation in the procedure from the performing physician.  

## 2020-01-07 NOTE — Progress Notes (Signed)
A/ox3, pleased with MAC, report to RN 

## 2020-01-07 NOTE — Op Note (Signed)
[facility] Patient Name: Alexandra White Procedure Date: 01/07/2020 2:10 PM MRN: [id number] Endoscopist: Thornton Park MD, MD Age: 64 Referring MD:  Date of Birth: [birth year] Gender: Female Account #: [id number] Procedure:                Colonoscopy Indications:              Screening for colorectal malignant neoplasm, This                            is the patient's first colonoscopy                           No known family history of colon cancer or polyps Medicines:                Monitored Anesthesia Care Procedure:                Pre-Anesthesia Assessment:                           - Prior to the procedure, a History and Physical                            was performed, and patient medications and                            allergies were reviewed. The patient's tolerance of                            previous anesthesia was also reviewed. The risks                            and benefits of the procedure and the sedation                            options and risks were discussed with the patient.                            All questions were answered, and informed consent                            was obtained. Prior Anticoagulants: The patient has                            taken no previous anticoagulant or antiplatelet                            agents. ASA Grade Assessment: II - A patient with                            mild systemic disease. After reviewing the risks                            and benefits, the patient was deemed in  satisfactory condition to undergo the procedure.                           After obtaining informed consent, the colonoscope                            was passed under direct vision. Throughout the                            procedure, the patient's blood pressure, pulse, and                            oxygen saturations were monitored continuously. The                            Colonoscope was  introduced through the anus and                            advanced to the 4 cm into the ileum. A second                            forward view of the right colon was performed. The                            colonoscopy was performed without difficulty. The                            patient tolerated the procedure well. The quality                            of the bowel preparation was good. The terminal                            ileum, ileocecal valve, appendiceal orifice, and                            rectum were photographed. Scope In: 2:28:19 PM Scope Out: 2:47:01 PM Scope Withdrawal Time: 0 hours 15 minutes 23 seconds  Total Procedure Duration: 0 hours 18 minutes 42 seconds  Findings:                 The perianal and digital rectal examinations were                            normal except for small hemorrhoids.                           A 3 mm polyp was found in the transverse colon. The                            polyp was sessile. The polyp was removed with a                            cold snare. Resection and  retrieval were complete.                            Estimated blood loss was minimal.                           A less than 1 mm polyp was found in the proximal                            ascending colon. It was too small to snare given                            it's location on the fold. The polyp was sessile.                            The polyp was removed with a piecemeal technique                            using a cold biopsy forceps. Resection and                            retrieval were complete. Estimated blood loss was                            minimal.                           The exam was otherwise without abnormality on                            direct and retroflexion views except for internal                            hemorrhoids. Complications:            No immediate complications. Estimated blood loss:                             Minimal. Estimated Blood Loss:     Estimated blood loss was minimal. Impression:               - One 3 mm polyp in the transverse colon, removed                            with a cold snare. Resected and retrieved.                           - One less than 1 mm polyp in the proximal                            ascending colon, removed piecemeal using a cold                            biopsy forceps. Resected and retrieved.                           -  The examination was otherwise normal on direct                            and retroflexion views. Recommendation:           - Patient has a contact number available for                            emergencies. The signs and symptoms of potential                            delayed complications were discussed with the                            patient. Return to normal activities tomorrow.                            Written discharge instructions were provided to the                            patient.                           - Resume previous diet.                           - Continue present medications.                           - Await pathology results.                           - Repeat colonoscopy date to be determined after                            pending pathology results are reviewed for                            surveillance.                           - Emerging evidence supports eating a diet of                            fruits, vegetables, grains, calcium, and yogurt                            while reducing red meat and alcohol may reduce the                            risk of colon cancer.                           - Thank you for allowing me to be involved in your  colon cancer prevention. Thornton Park MD, MD 01/07/2020 2:58:43 PM This report has been signed electronically.

## 2020-01-07 NOTE — Op Note (Signed)
[facility] Patient Name: Alexandra White Procedure Date: 01/07/2020 2:10 PM MRN: [id number] Endoscopist: Thornton Park MD, MD Age: 64 Referring MD:  Date of Birth: 1956/06/07 Gender: Female Account #: [id number] Procedure:                Upper GI endoscopy Indications:              Upper abdominal pain, Nausea Medicines:                Monitored Anesthesia Care Procedure:                Pre-Anesthesia Assessment:                           - Prior to the procedure, a History and Physical                            was performed, and patient medications and                            allergies were reviewed. The patient's tolerance of                            previous anesthesia was also reviewed. The risks                            and benefits of the procedure and the sedation                            options and risks were discussed with the patient.                            All questions were answered, and informed consent                            was obtained. Prior Anticoagulants: The patient has                            taken no previous anticoagulant or antiplatelet                            agents. ASA Grade Assessment: II - A patient with                            mild systemic disease. After reviewing the risks                            and benefits, the patient was deemed in                            satisfactory condition to undergo the procedure.                           After obtaining informed consent, the endoscope was  passed under direct vision. Throughout the                            procedure, the patient's blood pressure, pulse, and                            oxygen saturations were monitored continuously. The                            Endoscope was introduced through the mouth, and                            advanced to the third part of duodenum. The upper                            GI endoscopy was  accomplished without difficulty.                            The patient tolerated the procedure well. Scope In: Scope Out: Findings:                 The examined esophagus was normal. Biopsies were                            taken from the proximal/mid and distal esophagus                            with a cold forceps for histology. Estimated blood                            loss was minimal.                           Localized moderately erythematous mucosa without                            bleeding was found in the gastric body. Biopsies                            were taken with a cold forceps for histology.                            Estimated blood loss was minimal.                           The remainder of the examined stomach was normal.                            Biopsies were taken from the antrum, body, and                            fundus with a cold forceps for histology.                           A  small hiatal hernia was present.                           The examined duodenum was normal. Biopsies were                            taken with a cold forceps for histology. Estimated                            blood loss was minimal.                           The cardia and gastric fundus were normal on                            retroflexion. Complications:            No immediate complications. Estimated blood loss:                            Minimal. Estimated Blood Loss:     Estimated blood loss was minimal. Impression:               - Normal esophagus. Biopsied.                           - Erythematous mucosa in the gastric body. Biopsied.                           - Normal stomach. Biopsied.                           - Small hiatal hernia.                           - Normal examined duodenum. Biopsied. Recommendation:           - Patient has a contact number available for                            emergencies. The signs and symptoms of potential                             delayed complications were discussed with the                            patient. Return to normal activities tomorrow.                            Written discharge instructions were provided to the                            patient.                           - Resume previous diet.                           -  Continue present medications.                           - No aspirin, ibuprofen, naproxen, or other                            non-steroidal anti-inflammatory drugs.                           - Await pathology results.                           - Proceed with colonoscopy as previously planned. Thornton Park MD, MD 01/07/2020 2:55:42 PM This report has been signed electronically.

## 2020-01-11 ENCOUNTER — Telehealth: Payer: Self-pay

## 2020-01-11 ENCOUNTER — Telehealth: Payer: Self-pay | Admitting: *Deleted

## 2020-01-11 NOTE — Telephone Encounter (Signed)
Attempted to reach pt. With follow-up call following endoscopic procedure 01/07/2020.  Unable to LM, pt.'s voice mailbox full.  Will try to reach pt. Again later today.

## 2020-01-11 NOTE — Telephone Encounter (Signed)
2nd follow up call made.  NALM 

## 2020-01-11 NOTE — Telephone Encounter (Signed)
-----   Message from Patriciaann Clan, DO sent at 01/06/2020 11:38 AM EDT ----- Please let patient know that her kidney/liver function, thyroid function, electrolytes, and hemoglobin are all normal! Her A1c is the same as previous, in pre-diabetic range. Her white count is just slight low and would be unlikely to cause her symptoms, we can recheck that on future visits down the road. Thank you! Dr Higinio Plan.

## 2020-01-11 NOTE — Telephone Encounter (Signed)
Attempted to call pt multiple times no answer and mailbox is full. Pt has an appt on Monday. Tita Terhaar Kennon Holter, [name]

## 2020-01-12 ENCOUNTER — Encounter: Payer: Self-pay | Admitting: Gastroenterology

## 2020-01-13 ENCOUNTER — Other Ambulatory Visit: Payer: Self-pay

## 2020-01-13 DIAGNOSIS — A048 Other specified bacterial intestinal infections: Secondary | ICD-10-CM

## 2020-01-13 MED ORDER — PANTOPRAZOLE SODIUM 40 MG PO TBEC
40.0000 mg | DELAYED_RELEASE_TABLET | Freq: Two times a day (BID) | ORAL | 0 refills | Status: DC
Start: 2020-01-13 — End: 2021-08-02

## 2020-01-13 MED ORDER — TETRACYCLINE HCL 500 MG PO CAPS: 500.0000 mg | ORAL_CAPSULE | Freq: Four times a day (QID) | ORAL | 0 refills | Status: DC

## 2020-01-13 MED ORDER — BISMUTH 262 MG PO CHEW: 262.0000 mg | CHEWABLE_TABLET | Freq: Four times a day (QID) | ORAL | 0 refills | Status: DC

## 2020-01-13 MED ORDER — METRONIDAZOLE 500 MG PO TABS
500.0000 mg | ORAL_TABLET | Freq: Four times a day (QID) | ORAL | 0 refills | Status: DC
Start: 2020-01-13 — End: 2021-08-02

## 2020-01-17 ENCOUNTER — Ambulatory Visit: Payer: 59 | Admitting: Student in an Organized Health Care Education/Training Program

## 2020-01-27 ENCOUNTER — Telehealth: Payer: Self-pay | Admitting: Gastroenterology

## 2020-01-27 ENCOUNTER — Other Ambulatory Visit: Payer: Self-pay

## 2020-01-27 DIAGNOSIS — A048 Other specified bacterial intestinal infections: Secondary | ICD-10-CM

## 2020-01-27 NOTE — Telephone Encounter (Signed)
See result note.  

## 2020-01-27 NOTE — Telephone Encounter (Signed)
Patient is calling back as questions on results

## 2020-01-27 NOTE — Telephone Encounter (Signed)
Results reviewed with pt again, questions were answered.

## 2020-02-22 ENCOUNTER — Telehealth: Payer: Self-pay | Admitting: Gastroenterology

## 2020-02-22 NOTE — Telephone Encounter (Signed)
Pt will need to come and give a stool specimen to make sure the bacteria has cleared. We will call the pt when it is timie to do this test. Pt is scheduled to see Dr. Tarri Glenn 04/24/20 at 2:10pm. Left message for pt to call back.

## 2020-02-24 NOTE — Telephone Encounter (Signed)
Spoke with pt and she is aware of appt and knows we will call her when stool specimen needs to be done.

## 2020-04-24 ENCOUNTER — Ambulatory Visit: Payer: 59 | Admitting: Gastroenterology

## 2020-04-27 ENCOUNTER — Emergency Department (HOSPITAL_COMMUNITY)
Admission: EM | Admit: 2020-04-27 | Discharge: 2020-04-27 | Disposition: A | Discharge status: Home / Self Care | Payer: 59 | Attending: Emergency Medicine | Admitting: Emergency Medicine

## 2020-04-27 ENCOUNTER — Emergency Department (HOSPITAL_COMMUNITY): Payer: 59

## 2020-04-27 ENCOUNTER — Encounter (HOSPITAL_COMMUNITY): Payer: Self-pay

## 2020-04-27 ENCOUNTER — Other Ambulatory Visit: Payer: Self-pay

## 2020-04-27 DIAGNOSIS — R0781 Pleurodynia: Secondary | ICD-10-CM | POA: Insufficient documentation

## 2020-04-27 DIAGNOSIS — Z7722 Contact with and (suspected) exposure to environmental tobacco smoke (acute) (chronic): Secondary | ICD-10-CM | POA: Insufficient documentation

## 2020-04-27 DIAGNOSIS — R0789 Other chest pain: Secondary | ICD-10-CM

## 2020-04-27 DIAGNOSIS — I10 Essential (primary) hypertension: Secondary | ICD-10-CM | POA: Insufficient documentation

## 2020-04-27 MED ORDER — IBUPROFEN 400 MG PO TABS
400.0000 mg | ORAL_TABLET | Freq: Four times a day (QID) | ORAL | 0 refills | Status: DC | PRN
Start: 2020-04-27 — End: 2021-08-02

## 2020-04-27 MED ORDER — MORPHINE SULFATE (PF) 4 MG/ML IV SOLN
4.0000 mg | Freq: Once | INTRAVENOUS | Status: AC
  Administered 2020-04-27: 4 mg via INTRAVENOUS
  Filled 2020-04-27: qty 1

## 2020-04-27 MED ORDER — METHOCARBAMOL 750 MG PO TABS
750.0000 mg | ORAL_TABLET | Freq: Four times a day (QID) | ORAL | 0 refills | Status: DC
Start: 2020-04-27 — End: 2021-08-02

## 2020-04-27 MED ORDER — KETOROLAC TROMETHAMINE 30 MG/ML IJ SOLN
15.0000 mg | Freq: Once | INTRAMUSCULAR | Status: AC
  Administered 2020-04-27: 15 mg via INTRAVENOUS
  Filled 2020-04-27: qty 1

## 2020-04-27 NOTE — ED Triage Notes (Signed)
Pt presents with c/o left rib cage pain since yesterday. Pt reports hx of kidney stones in the past.

## 2020-04-27 NOTE — ED Provider Notes (Signed)
[facility] DEPT Provider Note   CSN: [id number] Arrival date & time: 04/27/20  1303     History Chief Complaint  Patient presents with  . Rib cage pain    Keyauna Graefe is a 64 y.o. female.  Six 60-year-old female presents with left-sided rib pain since yesterday.  Does have a history of kidney stones in the past but denies any urinary symptoms at this time.  Pain is worse with palpation to her rib cage as well as certain positions.  Denies any history of injuries.  No rashes appreciated.  Denies any burning sensation to the skin.  Symptoms worse with movement and better with remaining still.  Has used Tylenol with limited relief.        Past Medical History:  Diagnosis Date  . HTN (hypertension) 09/20/2009   Qualifier: Diagnosis of  By: Buelah Manis MD, Lonell Grandchild      Patient Active Problem List   Diagnosis Date Noted  . Pre-syncope 01/06/2020  . Sinus bradycardia 01/06/2020  . Blood pressure elevated without history of HTN 01/06/2020  . Constipation 12/01/2019  . Epigastric abdominal pain 11/18/2019  . Insomnia 07/16/2019  . Fatigue 07/16/2019  . Pre-diabetes 07/16/2019  . Cancer screening 03/11/2019  . Healthcare maintenance 08/23/2015  . Obesity 09/08/2012  . ASTHMA, INTERMITTENT 04/23/2010  . HLD (hyperlipidemia) 02/24/2008    Past Surgical History:  Procedure Laterality Date  . CESAREAN SECTION    . MULTIPLE TOOTH EXTRACTIONS       OB History   No obstetric history on file.     Family History  Problem Relation Age of Onset  . Heart disease Father   . Heart disease Brother   . Heart disease Sister   . Breast cancer Sister   . Heart disease Sister   . Pancreatic cancer Sister     Social History   Tobacco Use  . Smoking status: Passive Smoke Exposure - Never Smoker  . Smokeless tobacco: Never Used  Vaping Use  . Vaping Use: Never assessed  Substance Use Topics  . Alcohol use: No  . Drug use: Not Currently    Home  Medications Prior to Admission medications   Medication Sig Start Date End Date Taking? Authorizing Provider  acetaminophen-codeine (TYLENOL #3) 300-30 MG tablet Take 1 tablet by mouth every 4 (four) hours as needed for moderate pain.    [provider]  ampicillin (PRINCIPEN) 250 MG capsule Take 250 mg by mouth 4 (four) times daily.    [provider]  Bismuth 262 MG CHEW Chew 262 mg by mouth in the morning, at noon, in the evening, and at bedtime. 01/13/20   Thornton Park, MD  dicyclomine (BENTYL) 10 MG capsule Take 1 capsule (10 mg total) by mouth every 8 (eight) hours as needed for spasms. Patient not taking: Reported on 01/07/2020 12/01/19   Noralyn Pick, NP  famotidine (PEPCID) 20 MG tablet Take 1 tablet (20 mg total) by mouth 2 (two) times daily. 11/18/19   Meccariello, Bernita Raisin, DO  metroNIDAZOLE (FLAGYL) 500 MG tablet Take 1 tablet (500 mg total) by mouth 4 (four) times daily. 01/13/20   Thornton Park, MD  pantoprazole (PROTONIX) 40 MG tablet Take 1 tablet (40 mg total) by mouth 2 (two) times daily. 01/13/20   Thornton Park, MD  tetracycline (SUMYCIN) 500 MG capsule Take 1 capsule (500 mg total) by mouth 4 (four) times daily. 01/13/20   Thornton Park, MD    Allergies    Penicillins  Review of Systems   Review of Systems  All other systems reviewed and are negative.   Physical Exam Updated Vital Signs BP (!) 167/104 (BP Location: Left Arm)   Pulse 77   Temp 98 F (36.7 C) (Oral)   Resp 16   LMP 09/19/2010   SpO2 98%   Physical Exam Vitals and nursing note reviewed.  Constitutional:      General: She is not in acute distress.    Appearance: Normal appearance. She is well-developed. She is not toxic-appearing.  HENT:     Head: Normocephalic and atraumatic.  Eyes:     General: Lids are normal.     Conjunctiva/sclera: Conjunctivae normal.     Pupils: Pupils are equal, round, and reactive to light.  Neck:     Thyroid: No thyroid  mass.     Trachea: No tracheal deviation.  Cardiovascular:     Rate and Rhythm: Normal rate and regular rhythm.     Heart sounds: Normal heart sounds. No murmur heard.  No gallop.   Pulmonary:     Effort: Pulmonary effort is normal. No respiratory distress.     Breath sounds: Normal breath sounds. No stridor. No decreased breath sounds, wheezing, rhonchi or rales.  Chest:     Chest wall: Tenderness present. No mass, lacerations, swelling or crepitus.    Abdominal:     General: Bowel sounds are normal. There is no distension.     Palpations: Abdomen is soft.     Tenderness: There is no abdominal tenderness. There is no rebound.  Musculoskeletal:        General: No tenderness. Normal range of motion.     Cervical back: Normal range of motion and neck supple.  Skin:    General: Skin is warm and dry.     Findings: No abrasion or rash.  Neurological:     Mental Status: She is alert and oriented to person, place, and time.     GCS: GCS eye subscore is 4. GCS verbal subscore is 5. GCS motor subscore is 6.     Cranial Nerves: No cranial nerve deficit.     Sensory: No sensory deficit.  Psychiatric:        Speech: Speech normal.        Behavior: Behavior normal.     ED Results / Procedures / Treatments   Labs (all labs ordered are listed, but only abnormal results are displayed) Labs Reviewed - No data to display  EKG None  Radiology No results found.  Procedures Procedures (including critical care time)  Medications Ordered in ED Medications  ketorolac (TORADOL) 30 MG/ML injection 15 mg (has no administration in time range)  morphine 4 MG/ML injection 4 mg (has no administration in time range)    ED Course  I have reviewed the triage vital signs and the nursing notes.  Pertinent labs & imaging results that were available during my care of the patient were reviewed by me and considered in my medical decision making (see chart for details).    MDM  Rules/Calculators/A&P                         Patient medicated for pain here and feels better.  Chest x-ray is negative.  Patient with musculoskeletal chest pain will discharge home Final Clinical Impression(s) / ED Diagnoses Final diagnoses:  None    Rx / DC Orders ED Discharge Orders    None  Lacretia Leigh, MD 04/27/20 [phone]

## 2020-07-03 ENCOUNTER — Other Ambulatory Visit: Payer: Self-pay | Admitting: Student in an Organized Health Care Education/Training Program

## 2020-07-03 ENCOUNTER — Telehealth: Payer: Self-pay | Admitting: Student in an Organized Health Care Education/Training Program

## 2020-07-03 DIAGNOSIS — Z1231 Encounter for screening mammogram for malignant neoplasm of breast: Secondary | ICD-10-CM

## 2020-07-03 NOTE — Telephone Encounter (Signed)
Patient would like for Dr. Ouida Sills to place a referral for her to have a mammogram.   The best call back number with any questions is [phone].

## 2020-07-03 NOTE — Telephone Encounter (Signed)
Order has been placed for screening mammogram

## 2020-07-03 NOTE — Progress Notes (Signed)
Mammogram screening ordered.

## 2020-07-18 ENCOUNTER — Ambulatory Visit: Payer: 59 | Admitting: Family Medicine

## 2020-08-24 ENCOUNTER — Encounter (HOSPITAL_COMMUNITY): Payer: Self-pay

## 2020-08-24 ENCOUNTER — Emergency Department (HOSPITAL_COMMUNITY)
Admission: EM | Admit: 2020-08-24 | Discharge: 2020-08-24 | Disposition: A | Discharge status: Home / Self Care | Payer: 59 | Attending: Emergency Medicine | Admitting: Emergency Medicine

## 2020-08-24 ENCOUNTER — Emergency Department (HOSPITAL_COMMUNITY): Payer: 59

## 2020-08-24 ENCOUNTER — Other Ambulatory Visit: Payer: Self-pay

## 2020-08-24 DIAGNOSIS — R1011 Right upper quadrant pain: Secondary | ICD-10-CM | POA: Insufficient documentation

## 2020-08-24 DIAGNOSIS — M545 Low back pain, unspecified: Secondary | ICD-10-CM | POA: Diagnosis present

## 2020-08-24 DIAGNOSIS — J45909 Unspecified asthma, uncomplicated: Secondary | ICD-10-CM | POA: Insufficient documentation

## 2020-08-24 DIAGNOSIS — I10 Essential (primary) hypertension: Secondary | ICD-10-CM | POA: Diagnosis not present

## 2020-08-24 DIAGNOSIS — Z7722 Contact with and (suspected) exposure to environmental tobacco smoke (acute) (chronic): Secondary | ICD-10-CM | POA: Insufficient documentation

## 2020-08-24 LAB — URINALYSIS, ROUTINE W REFLEX MICROSCOPIC
Bilirubin Urine: NEGATIVE
Glucose, UA: NEGATIVE mg/dL
Ketones, ur: NEGATIVE mg/dL
Nitrite: NEGATIVE
Protein, ur: NEGATIVE mg/dL
Specific Gravity, Urine: 1.013 (ref 1.005–1.030)
pH: 5 (ref 5.0–8.0)

## 2020-08-24 LAB — COMPREHENSIVE METABOLIC PANEL
ALT: 17 U/L (ref 0–44)
AST: 17 U/L (ref 15–41)
Albumin: 4.2 g/dL (ref 3.5–5.0)
Alkaline Phosphatase: 66 U/L (ref 38–126)
Anion gap: 8 (ref 5–15)
BUN: 7 mg/dL — ABNORMAL LOW (ref 8–23)
CO2: 24 mmol/L (ref 22–32)
Calcium: 9.3 mg/dL (ref 8.9–10.3)
Chloride: 107 mmol/L (ref 98–111)
Creatinine, Ser: 0.76 mg/dL (ref 0.44–1.00)
GFR, Estimated: >60 mL/min (ref >60)
Glucose, Bld: 99 mg/dL (ref 70–99)
Potassium: 4.2 mmol/L (ref 3.5–5.1)
Sodium: 139 mmol/L (ref 135–145)
Total Bilirubin: 0.8 mg/dL (ref 0.3–1.2)
Total Protein: 7.2 g/dL (ref 6.5–8.1)

## 2020-08-24 LAB — CBC
HCT: 43 % (ref 36.0–46.0)
Hemoglobin: 13.7 g/dL (ref 12.0–15.0)
MCH: 28 pg (ref 26.0–34.0)
MCHC: 31.9 g/dL (ref 30.0–36.0)
MCV: 87.9 fL (ref 80.0–100.0)
Platelets: 229 10*3/uL (ref 150–400)
RBC: 4.89 MIL/uL (ref 3.87–5.11)
RDW: 13.7 % (ref 11.5–15.5)
WBC: 3.3 10*3/uL — ABNORMAL LOW (ref 4.0–10.5)
nRBC: 0 % (ref 0.0–0.2)

## 2020-08-24 LAB — LIPASE, BLOOD: Lipase: 24 U/L (ref 11–51)

## 2020-08-24 MED ORDER — CYCLOBENZAPRINE HCL 10 MG PO TABS
10.0000 mg | ORAL_TABLET | Freq: Once | ORAL | Status: DC
  Filled 2020-08-24: qty 1

## 2020-08-24 MED ORDER — CYCLOBENZAPRINE HCL 10 MG PO TABS: 10.0000 mg | ORAL_TABLET | Freq: Two times a day (BID) | ORAL | 0 refills | Status: DC | PRN

## 2020-08-24 MED ORDER — ONDANSETRON 4 MG PO TBDP
4.0000 mg | ORAL_TABLET | Freq: Once | ORAL | Status: DC
  Filled 2020-08-24: qty 1

## 2020-08-24 NOTE — ED Notes (Signed)
Brought flexeril to pt, asked pt if she had a ride home/would not be driving. Pt states she drove here and is insisting on driving home. States she does not have any other ride/person to call. Will not give muscle relaxer d/t pt driving.

## 2020-08-24 NOTE — ED Provider Notes (Signed)
[facility] DEPT Provider Note   CSN: [id number] Arrival date & time: 08/24/20  1244     History Chief Complaint  Patient presents with  . back spasm    Alexandra White is a 65 y.o. female.  HPI   Patient with significant medical history of hypertension, obesity, hyperlipidemia presents with chief complaint of bilateral back spasms.  Patient endorses pain started on Saturday, she feels spasms in her upper thoracic region, and it will occasionally radiate around into her abdomen.  Initially, it started on her right upper flank, but now it has localized down in her left lower back.  She denies associated urinary symptoms, denies hematuria, urinary urgency, frequency, vaginal pain, vaginal discharge, vaginal bleeding.  She has no abdominal history, denies kidney stones, pancreatitis, UTIs or pyelonephritis, she does endorse that she has been told that she has gallbladder problems.  She denies nausea, vomiting, diarrhea, constipation. Patient denies recent trauma to her back, denies paresthesia or weakness in the lower extremities, no saddle paresthesias no urinary incontinence or retention.  Patient denies headaches, fevers, chills, shortness of breath, chest pain, abdominal pain, worsening pedal edema.  Past Medical History:  Diagnosis Date  . HTN (hypertension) 09/20/2009   Qualifier: Diagnosis of  By: Buelah Manis MD, Lonell Grandchild      Patient Active Problem List   Diagnosis Date Noted  . Pre-syncope 01/06/2020  . Sinus bradycardia 01/06/2020  . Blood pressure elevated without history of HTN 01/06/2020  . Constipation 12/01/2019  . Epigastric abdominal pain 11/18/2019  . Insomnia 07/16/2019  . Fatigue 07/16/2019  . Pre-diabetes 07/16/2019  . Cancer screening 03/11/2019  . Healthcare maintenance 08/23/2015  . Obesity 09/08/2012  . ASTHMA, INTERMITTENT 04/23/2010  . HLD (hyperlipidemia) 02/24/2008    Past Surgical History:  Procedure Laterality Date  .  CESAREAN SECTION    . MULTIPLE TOOTH EXTRACTIONS       OB History   No obstetric history on file.     Family History  Problem Relation Age of Onset  . Heart disease Father   . Heart disease Brother   . Heart disease Sister   . Breast cancer Sister   . Heart disease Sister   . Pancreatic cancer Sister     Social History   Tobacco Use  . Smoking status: Passive Smoke Exposure - Never Smoker  . Smokeless tobacco: Never Used  Vaping Use  . Vaping Use: Never used  Substance Use Topics  . Alcohol use: No  . Drug use: Not Currently    Home Medications Prior to Admission medications   Medication Sig Start Date End Date Taking? Authorizing Provider  acetaminophen (TYLENOL) 500 MG tablet Take 1,000 mg by mouth every 6 (six) hours as needed.   Yes [provider]  cyclobenzaprine (FLEXERIL) 10 MG tablet Take 1 tablet (10 mg total) by mouth 2 (two) times daily as needed for muscle spasms. 08/24/20  Yes Marcello Fennel, PA-C  acetaminophen-codeine (TYLENOL #3) 300-30 MG tablet Take 1 tablet by mouth every 4 (four) hours as needed for moderate pain.    [provider]  Bismuth 262 MG CHEW Chew 262 mg by mouth in the morning, at noon, in the evening, and at bedtime. Patient not taking: No sig reported 01/13/20   Thornton Park, MD  dicyclomine (BENTYL) 10 MG capsule Take 1 capsule (10 mg total) by mouth every 8 (eight) hours as needed for spasms. Patient not taking: No sig reported 12/01/19   Noralyn Pick, NP  famotidine (PEPCID) 20 MG tablet Take 1 tablet (20 mg total) by mouth 2 (two) times daily. Patient not taking: No sig reported 11/18/19   Meccariello, Bernita Raisin, DO  ibuprofen (ADVIL) 400 MG tablet Take 1 tablet (400 mg total) by mouth every 6 (six) hours as needed. Patient not taking: No sig reported 04/27/20   Lacretia Leigh, MD  methocarbamol (ROBAXIN-750) 750 MG tablet Take 1 tablet (750 mg total) by mouth 4 (four) times daily. Patient not  taking: No sig reported 04/27/20   Lacretia Leigh, MD  metroNIDAZOLE (FLAGYL) 500 MG tablet Take 1 tablet (500 mg total) by mouth 4 (four) times daily. Patient not taking: No sig reported 01/13/20   Thornton Park, MD  pantoprazole (PROTONIX) 40 MG tablet Take 1 tablet (40 mg total) by mouth 2 (two) times daily. Patient not taking: No sig reported 01/13/20   Thornton Park, MD  tetracycline (SUMYCIN) 500 MG capsule Take 1 capsule (500 mg total) by mouth 4 (four) times daily. Patient not taking: No sig reported 01/13/20   Thornton Park, MD    Allergies    Amoxicillin and Penicillins  Review of Systems   Review of Systems  Constitutional: Negative for chills and fever.  HENT: Negative for congestion and sore throat.   Respiratory: Negative for shortness of breath.   Cardiovascular: Negative for chest pain.  Gastrointestinal: Positive for nausea. Negative for abdominal pain and vomiting.  Genitourinary: Negative for difficulty urinating, dyspareunia, dysuria, enuresis, vaginal bleeding and vaginal discharge.  Musculoskeletal: Positive for back pain.  Skin: Negative for rash.  Neurological: Negative for dizziness.  Hematological: Does not bruise/bleed easily.    Physical Exam Updated Vital Signs BP (!) 185/97   Pulse (!) 58   Temp 98.9 F (37.2 C) (Oral)   Resp 18   Ht 5' 7"  (1.702 m)   Wt 91.6 kg   LMP 09/19/2010   SpO2 100%   BMI 31.64 kg/m   Physical Exam Vitals and nursing note reviewed.  Constitutional:      General: She is not in acute distress.    Appearance: She is not ill-appearing.  HENT:     Head: Normocephalic and atraumatic.     Nose: No congestion.  Eyes:     Conjunctiva/sclera: Conjunctivae normal.  Cardiovascular:     Rate and Rhythm: Normal rate and regular rhythm.     Pulses: Normal pulses.     Heart sounds: No murmur heard. No friction rub. No gallop.   Pulmonary:     Effort: No respiratory distress.     Breath sounds: No wheezing, rhonchi  or rales.  Abdominal:     Palpations: Abdomen is soft.     Tenderness: There is abdominal tenderness. There is no right CVA tenderness or left CVA tenderness.     Comments: Patients abdomen is nondistended, normoactive bowel sounds, dull to percussion.  She has  tenderness in her right upper quadrant, no peritoneal sign, negative McBurney point.  Negative CVA tenderness  Musculoskeletal:     Right lower leg: No edema.     Left lower leg: No edema.     Comments: Spine was palpated it was nontender to palpation, no step-off or deformities noted.  She was tender to palpation along his left iliac crest within the musculature, no deformities present.  She is moving all 4 extremities at difficulty, she had negative straight leg raise.  Skin:    General: Skin is warm and dry.  Neurological:     Mental Status:  She is alert.  Psychiatric:        Mood and Affect: Mood normal.     ED Results / Procedures / Treatments   Labs (all labs ordered are listed, but only abnormal results are displayed) Labs Reviewed  COMPREHENSIVE METABOLIC PANEL - Abnormal; Notable for the following components:      Result Value   BUN 7 (*)    All other components within normal limits  CBC - Abnormal; Notable for the following components:   WBC 3.3 (*)    All other components within normal limits  URINALYSIS, ROUTINE W REFLEX MICROSCOPIC - Abnormal; Notable for the following components:   Hgb urine dipstick SMALL (*)    Leukocytes,Ua MODERATE (*)    Bacteria, UA RARE (*)    All other components within normal limits  URINE CULTURE  LIPASE, BLOOD    EKG None  Radiology No results found.  Procedures Procedures   Medications Ordered in ED Medications  cyclobenzaprine (FLEXERIL) tablet 10 mg (10 mg Oral Patient Refused/Not Given 08/24/20 1518)  ondansetron (ZOFRAN-ODT) disintegrating tablet 4 mg (4 mg Oral Patient Refused/Not Given 08/24/20 1519)    ED Course  I have reviewed the triage vital signs and the  nursing notes.  Pertinent labs & imaging results that were available during my care of the patient were reviewed by me and considered in my medical decision making (see chart for details).    MDM Rules/Calculators/A&P                          Initial impression-patient presents with bilateral back spasms.  She is alert, does not appear in acute distress, vital signs reassuring.  Concern for gallbladder abnormalities, UTI, Pilo, muscular strain.  Will obtain basic lab work-up, limited ultrasound and provide patient with nausea medication pain medications reevaluate  Work-up- CBC shows leukocytopenia, with no other abnormalities, CMP is unremarkable.  Lipase is 24.  UA shows moderate leukocytes, 11-20 white blood cells, rare bacteria.  Limited ultrasound shows cholelithiasis without acute cystitis  Reassessment patient is reassessed, has no complaints at this time, states pain has subsided.  Vital signs remained stable.  Rule out-low suspicion for systemic infection as patient is nontoxic-appearing, vital signs reassuring.  Low suspicion for kidney stone as there is no noted blood within her urine, she has no history of kidney stones.  Low suspicion for UTI or pyelonephritis as patient denies urinary symptoms, no CVA tenderness on my exam.  UA does shows moderate leukocytes with rare bacteria, will defer treatment at this time as she has no urinary symptoms.  Will culture urine for further evaluation.  Low suspicion for pancreatitis as lipase is within normal limits, she has no risk factors.  Low suspicion for intra-abdominal abnormality requiring immediate intervention as abdomen soft nontender to palpation no peritoneal sign, tolerating p.o. without difficulty.  Low suspicion for gallbladder abnormality as there is no elevation liver enzymes, no elevation in alk phos, ultrasound shows cholelithiasis without evidence of acute cholecystitis.  Plan-suspect patient suffering from muscular strain, will  provide her with muscle relaxers, recommend exercising warm compresses.  Follow-up with PCP in 1 week's time if symptoms have not fully resolved.  Vital signs have remained stable, no indication for hospital admission.  Patient discussed with attending and they agreed with assessment and plan.  Patient given at home care as well strict return precautions.  Patient verbalized that they understood agreed to said plan.   Final Clinical  Impression(s) / ED Diagnoses Final diagnoses:  Acute left-sided low back pain without sciatica    Rx / DC Orders ED Discharge Orders         Ordered    cyclobenzaprine (FLEXERIL) 10 MG tablet  2 times daily PRN        08/24/20 1606           Aron Baba 08/24/20 1612    Margette Fast, MD 08/25/20 [phone]

## 2020-08-24 NOTE — Discharge Instructions (Addendum)
You have been seen here for back pain.  I have given you prescription for muscle relaxers please be aware that this medication make you drowsy do not consume alcohol or operate heavy machinery while taking this medication.  Recommend taking this at nighttime.  I recommend taking over-the-counter pain medications like ibuprofen and/or Tylenol every 6 as needed.  Please follow dosage and on the back of bottle.  I also recommend applying heat to the area and stretching out the muscles as this will help decrease stiffness and pain.  I have given you information on exercises please follow.  Please follow-up with your PCP for further evaluation if symptoms do not resolve 1 week's time.  Come back to the emergency department if you develop chest pain, shortness of breath, severe abdominal pain, uncontrolled nausea, vomiting, diarrhea.

## 2020-08-24 NOTE — ED Triage Notes (Signed)
Patient reports that she is having bilateral lower back spasms x 5 days. Patient then adds that she is having abdominal bloating and reports issues with her gallbladder, but states she is here for back spasms.

## 2020-08-26 LAB — URINE CULTURE: Culture: <10000 — AB

## 2020-09-06 ENCOUNTER — Encounter: Payer: 59 | Admitting: Student in an Organized Health Care Education/Training Program

## 2020-11-15 ENCOUNTER — Ambulatory Visit (INDEPENDENT_AMBULATORY_CARE_PROVIDER_SITE_OTHER): Payer: 59 | Admitting: Student in an Organized Health Care Education/Training Program

## 2020-11-15 ENCOUNTER — Encounter: Payer: Self-pay | Admitting: Student in an Organized Health Care Education/Training Program

## 2020-11-15 ENCOUNTER — Other Ambulatory Visit: Payer: Self-pay

## 2020-11-15 VITALS — BP 150/100 | HR 87 | Ht 67.0 in | Wt 206.2 lb

## 2020-11-15 DIAGNOSIS — Z1231 Encounter for screening mammogram for malignant neoplasm of breast: Secondary | ICD-10-CM | POA: Diagnosis not present

## 2020-11-15 DIAGNOSIS — R7303 Prediabetes: Secondary | ICD-10-CM

## 2020-11-15 DIAGNOSIS — Z0000 Encounter for general adult medical examination without abnormal findings: Secondary | ICD-10-CM

## 2020-11-15 DIAGNOSIS — R1013 Epigastric pain: Secondary | ICD-10-CM

## 2020-11-15 DIAGNOSIS — A048 Other specified bacterial intestinal infections: Secondary | ICD-10-CM

## 2020-11-15 DIAGNOSIS — R011 Cardiac murmur, unspecified: Secondary | ICD-10-CM

## 2020-11-15 DIAGNOSIS — I10 Essential (primary) hypertension: Secondary | ICD-10-CM | POA: Diagnosis not present

## 2020-11-15 DIAGNOSIS — E7841 Elevated Lipoprotein(a): Secondary | ICD-10-CM

## 2020-11-15 MED ORDER — LOSARTAN POTASSIUM-HCTZ 50-12.5 MG PO TABS
1.0000 | ORAL_TABLET | Freq: Every day | ORAL | 0 refills | Status: DC
Start: 2020-11-15 — End: 2020-12-07

## 2020-11-15 NOTE — Progress Notes (Signed)
   SUBJECTIVE:   CHIEF COMPLAINT / HPI: physical  Concerns today include: Left side underarm/breast tenderness. intermittent. Woke up with it. Occurs mainly at night when laying down on any side. Lasts 4-5 minutes.Took advil for it. Eases it but doesn't relieve it.  No injury. Sometimes sharp.  Tender to touch.  Gained 6 pounds Started walking for exercise due to weight gain about 2 days ago.  Not present when walking. H/o gallstones. Low grease foods diet. Not spicy. No alcohol.  No herbs or supplements.  Does not hurt with deep breath. No rash or skin changes.  Radiates up to breast.  Tea or coffee before bed- black. Creamer and sugar earlier in the day. Has h/o recently treated H. Pylori as identified by biopsy with EGD. Has not had test of cure yet.   Denies history of tobacco use, no alcohol or illicit substances. Just started exercise program 35 times per week. She denies new sexual partner or concerns for STIs.  She feels safe in her relationship. She is up-to-date on colonoscopy and vaccinations. mammogram order expired prior to scheduling. Needs a new order.  OBJECTIVE:   BP (!) 150/100   Pulse 87   Ht 5\' 7"  (1.702 m)   Wt 206 lb 3.2 oz (93.5 kg)   LMP 09/19/2010   SpO2 97%   BMI 32.30 kg/m   BP when rechecked by provider: 150/95 Physical Exam Vitals reviewed.  Cardiovascular:     Rate and Rhythm: Normal rate and regular rhythm.     Chest Wall: No thrill.     Pulses: Normal pulses.     Heart sounds: Murmur (systolic ejection) heard.  Gallop present. S3 sounds present.   Musculoskeletal:     Right lower leg: Edema (trace) present.     Left lower leg: Edema (trace) present.    The 10-year ASCVD risk score Mikey Bussing DC Brooke Bonito., et al., 2013) is: 12.9%   Values used to calculate the score:     Age: 65 years     Sex: Female     Is Non-Hispanic African American: Yes     Diabetic: No     Tobacco smoker: No     Systolic Blood Pressure: 144 mmHg     Is BP treated:  Yes     HDL Cholesterol: 68 mg/dL     Total Cholesterol: 234 mg/dL  ASSESSMENT/PLAN:   Pre-diabetes A1c up to 6.1 today Patient has implemented diet and exercise changes recently Recheck in 3 months Consider gastroparesis contributing to symptoms  Healthcare maintenance Reordered mammogram   HLD (hyperlipidemia) Remains elevated.  Now with HTN and pre-diabetes worsening.  Starting atorvastatin 40mg  daily.   Epigastric abdominal pain Reproducible on exam in epigastric and RUQ quadrant.  Positional.  Has not had test of cure for h. Pylori after recently completing quadruple therapy.  Stool Ag ordered today.   HTN (hypertension) Elevated multiple appointments in a row and on repeat today.  Discussed in detail the importance of treatment for preventing complications.  Patient understood and agreed to start treatment.  CBC and CMP today Losartan/HCTZ 50/12.5 started today Repeat labs and BP check in 2 weeks     Richarda Osmond, [place]

## 2020-11-15 NOTE — Patient Instructions (Signed)
It was a pleasure to see you today!  To summarize our discussion for this visit:  I am re-ordering your mammogram  We will check for H.pylori today and I can send in an antacid treatment to help you feel better in the meantime.   I would like to check your kidneys, anemia, cholesterol, TSH, and diabetes screening today  I think we should start a blood pressure medication today  Some additional health maintenance measures we should update are: Health Maintenance Due  Topic Date Due  . COVID-19 Vaccine (1) Never done  . TETANUS/TDAP  Never done  . MAMMOGRAM  Never done  .    Please return to our clinic to see me in 2 weeks for blood pressure follow up.  Call the clinic at [phone] if your symptoms worsen or you have any concerns.   Thank you for allowing me to take part in your care,  Dr. Doristine Mango

## 2020-11-16 ENCOUNTER — Telehealth: Payer: Self-pay

## 2020-11-16 LAB — COMPREHENSIVE METABOLIC PANEL
ALT: 12 IU/L (ref 0–32)
AST: 13 IU/L (ref 0–40)
Albumin/Globulin Ratio: 1.7 (ref 1.2–2.2)
Albumin: 4.4 g/dL (ref 3.8–4.8)
Alkaline Phosphatase: 79 IU/L (ref 44–121)
BUN/Creatinine Ratio: 20 (ref 12–28)
BUN: 15 mg/dL (ref 8–27)
Bilirubin Total: <0.2 mg/dL (ref 0.0–1.2)
CO2: 20 mmol/L (ref 20–29)
Calcium: 9 mg/dL (ref 8.7–10.3)
Chloride: 108 mmol/L — ABNORMAL HIGH (ref 96–106)
Creatinine, Ser: 0.75 mg/dL (ref 0.57–1.00)
Globulin, Total: 2.6 g/dL (ref 1.5–4.5)
Glucose: 111 mg/dL — ABNORMAL HIGH (ref 65–99)
Potassium: 3.9 mmol/L (ref 3.5–5.2)
Sodium: 142 mmol/L (ref 134–144)
Total Protein: 7 g/dL (ref 6.0–8.5)
eGFR: 89 mL/min/{1.73_m2} (ref >59)

## 2020-11-16 LAB — LIPID PANEL
Chol/HDL Ratio: 3.4 ratio (ref 0.0–4.4)
Cholesterol, Total: 234 mg/dL — ABNORMAL HIGH (ref 100–199)
HDL: 68 mg/dL (ref >39)
LDL Chol Calc (NIH): 141 mg/dL — ABNORMAL HIGH (ref 0–99)
Triglycerides: 144 mg/dL (ref 0–149)
VLDL Cholesterol Cal: 25 mg/dL (ref 5–40)

## 2020-11-16 LAB — CBC
Hematocrit: 41 % (ref 34.0–46.6)
Hemoglobin: 13.3 g/dL (ref 11.1–15.9)
MCH: 27.7 pg (ref 26.6–33.0)
MCHC: 32.4 g/dL (ref 31.5–35.7)
MCV: 85 fL (ref 79–97)
Platelets: 266 10*3/uL (ref 150–450)
RBC: 4.8 x10E6/uL (ref 3.77–5.28)
RDW: 13.2 % (ref 11.7–15.4)
WBC: 4.7 10*3/uL (ref 3.4–10.8)

## 2020-11-16 LAB — HEMOGLOBIN A1C
Est. average glucose Bld gHb Est-mCnc: 128 mg/dL
Hgb A1c MFr Bld: 6.1 % — ABNORMAL HIGH (ref 4.8–5.6)

## 2020-11-16 MED ORDER — ATORVASTATIN CALCIUM 40 MG PO TABS: 40.0000 mg | ORAL_TABLET | Freq: Every day | ORAL | 3 refills | Status: DC

## 2020-11-16 NOTE — Assessment & Plan Note (Signed)
Elevated multiple appointments in a row and on repeat today.  Discussed in detail the importance of treatment for preventing complications.  Patient understood and agreed to start treatment.  CBC and CMP today Losartan/HCTZ 50/12.5 started today Repeat labs and BP check in 2 weeks

## 2020-11-16 NOTE — Assessment & Plan Note (Signed)
Remains elevated.  Now with HTN and pre-diabetes worsening.  Starting atorvastatin 40mg  daily.

## 2020-11-16 NOTE — Assessment & Plan Note (Signed)
Re-ordered mammogram.   

## 2020-11-16 NOTE — Telephone Encounter (Signed)
Patient calls nurse line returning "someones" call. Patient advised of cholesterol result and the need to start a statin. Patient advised atorvastatin has been sent to her pharmacy and directions for use given. Patient stated she took her new BP med last night at 6 pm and her blood pressure this morning was 157/102. Patient denies any symptoms at this time. Patient advised to keep a journal of her blood pressure today and tomorrow and to call with findings. ED precautions given for high BP. Patient agreed with plan and will call back tomorrow afternoon.

## 2020-11-16 NOTE — Assessment & Plan Note (Addendum)
A1c up to 6.1 today Patient has implemented diet and exercise changes recently Recheck in 3 months Consider gastroparesis contributing to symptoms

## 2020-11-16 NOTE — Assessment & Plan Note (Signed)
Reproducible on exam in epigastric and RUQ quadrant.  Positional.  Has not had test of cure for h. Pylori after recently completing quadruple therapy.  Stool Ag ordered today.

## 2020-11-19 LAB — H. PYLORI ANTIGEN, STOOL: H pylori Ag, Stl: NEGATIVE

## 2020-11-23 ENCOUNTER — Encounter: Payer: Self-pay | Admitting: Student in an Organized Health Care Education/Training Program

## 2020-11-23 ENCOUNTER — Other Ambulatory Visit: Payer: Self-pay

## 2020-11-23 ENCOUNTER — Ambulatory Visit (INDEPENDENT_AMBULATORY_CARE_PROVIDER_SITE_OTHER): Payer: 59 | Admitting: Student in an Organized Health Care Education/Training Program

## 2020-11-23 VITALS — BP 130/84 | HR 82 | Ht 67.0 in | Wt 207.6 lb

## 2020-11-23 DIAGNOSIS — Z79899 Other long term (current) drug therapy: Secondary | ICD-10-CM

## 2020-11-23 DIAGNOSIS — I10 Essential (primary) hypertension: Secondary | ICD-10-CM | POA: Diagnosis not present

## 2020-11-23 DIAGNOSIS — R1013 Epigastric pain: Secondary | ICD-10-CM | POA: Diagnosis not present

## 2020-11-23 DIAGNOSIS — R7303 Prediabetes: Secondary | ICD-10-CM | POA: Diagnosis not present

## 2020-11-23 DIAGNOSIS — E7841 Elevated Lipoprotein(a): Secondary | ICD-10-CM

## 2020-11-23 NOTE — Patient Instructions (Addendum)
It was a pleasure to see you today!  To summarize our discussion for this visit:  I'm so glad to hear that you are feeling better. Keep up the good work with diet and exercise!  Your blood pressure is much improved so we will continue your current medication and recheck your kidneys/electrolytes today and follow up in about 6 months if all looks well.  Please try voltaren gel for your side tenderness and let me know if not improving or resolving in a few weeks.  I am providing you with the breast center information so please call them to schedule your mammogram.   Some additional health maintenance measures we should update are: Health Maintenance Due  Topic Date Due  . COVID-19 Vaccine (1) Never done  . TETANUS/TDAP  Never done  . MAMMOGRAM  Never done  .   Please return to our clinic to see me in about 6 months or sooner if needed.  Call the clinic at [phone] if your symptoms worsen or you have any concerns.   Thank you for allowing me to take part in your care,  Dr. Doristine Mango   [email or web address]  DASH Eating Plan DASH stands for Dietary Approaches to Stop Hypertension. The DASH eating plan is a healthy eating plan that has been shown to:  Reduce high blood pressure (hypertension).  Reduce your risk for type 2 diabetes, heart disease, and stroke.  Help with weight loss. What are tips for following this plan? Reading food labels  Check food labels for the amount of salt (sodium) per serving. Choose foods with less than 5 percent of the Daily Value of sodium. Generally, foods with less than 300 milligrams (mg) of sodium per serving fit into this eating plan.  To find whole grains, look for the word "whole" as the first word in the ingredient list. Shopping  Buy products labeled as "low-sodium" or "no salt added."  Buy fresh foods. Avoid canned foods and pre-made or frozen meals. Cooking  Avoid adding salt  when cooking. Use salt-free seasonings or herbs instead of table salt or sea salt. Check with your health care provider or pharmacist before using salt substitutes.  Do not fry foods. Cook foods using healthy methods such as baking, boiling, grilling, roasting, and broiling instead.  Cook with heart-healthy oils, such as olive, canola, avocado, soybean, or sunflower oil. Meal planning  Eat a balanced diet that includes: ? 4 or more servings of fruits and 4 or more servings of vegetables each day. Try to fill one-half of your plate with fruits and vegetables. ? 6-8 servings of whole grains each day. ? Less than 6 oz (170 g) of lean meat, poultry, or fish each day. A 3-oz (85-g) serving of meat is about the same size as a deck of cards. One egg equals 1 oz (28 g). ? 2-3 servings of low-fat dairy each day. One serving is 1 cup (237 mL). ? 1 serving of nuts, seeds, or beans 5 times each week. ? 2-3 servings of heart-healthy fats. Healthy fats called omega-3 fatty acids are found in foods such as walnuts, flaxseeds, fortified milks, and eggs. These fats are also found in cold-water fish, such as sardines, salmon, and mackerel.  Limit how much you eat of: ? Canned or prepackaged foods. ? Food that is high in trans fat, such as some fried foods. ? Food that is high in saturated fat, such as fatty meat. ? Desserts and other sweets, sugary drinks, and other foods with  added sugar. ? Full-fat dairy products.  Do not salt foods before eating.  Do not eat more than 4 egg yolks a week.  Try to eat at least 2 vegetarian meals a week.  Eat more home-cooked food and less restaurant, buffet, and fast food.   Lifestyle  When eating at a restaurant, ask that your food be prepared with less salt or no salt, if possible.  If you drink alcohol: ? Limit how much you use to:  0-1 drink a day for women who are not pregnant.  0-2 drinks a day for men. ? Be aware of how much alcohol is in your drink. In  the [place] one drink equals one 12 oz bottle of beer (355 mL), one 5 oz glass of wine (148 mL), or one 1 oz glass of hard liquor (44 mL). General information  Avoid eating more than 2,300 mg of salt a day. If you have hypertension, you may need to reduce your sodium intake to 1,500 mg a day.  Work with your health care provider to maintain a healthy body weight or to lose weight. Ask what an ideal weight is for you.  Get at least 30 minutes of exercise that causes your heart to beat faster (aerobic exercise) most days of the week. Activities may include walking, swimming, or biking.  Work with your health care provider or dietitian to adjust your eating plan to your individual calorie needs. What foods should I eat? Fruits All fresh, dried, or frozen fruit. Canned fruit in natural juice (without added sugar). Vegetables Fresh or frozen vegetables (raw, steamed, roasted, or grilled). Low-sodium or reduced-sodium tomato and vegetable juice. Low-sodium or reduced-sodium tomato sauce and tomato paste. Low-sodium or reduced-sodium canned vegetables. Grains Whole-grain or whole-wheat bread. Whole-grain or whole-wheat pasta. Brown rice. [place]. Bulgur. Whole-grain and low-sodium cereals. Pita bread. Low-fat, low-sodium crackers. Whole-wheat flour tortillas. Meats and other proteins Skinless chicken or [place]. Ground chicken or [place]. Pork with fat trimmed off. Fish and seafood. Egg whites. Dried beans, peas, or lentils. Unsalted nuts, nut butters, and seeds. Unsalted canned beans. Lean cuts of beef with fat trimmed off. Low-sodium, lean precooked or cured meat, such as sausages or meat loaves. Dairy Low-fat (1%) or fat-free (skim) milk. Reduced-fat, low-fat, or fat-free cheeses. Nonfat, low-sodium ricotta or cottage cheese. Low-fat or nonfat yogurt. Low-fat, low-sodium cheese. Fats and oils Soft margarine without trans fats. Vegetable oil. Reduced-fat, low-fat, or light mayonnaise and salad  dressings (reduced-sodium). Canola, safflower, olive, avocado, soybean, and sunflower oils. Avocado. Seasonings and condiments Herbs. Spices. Seasoning mixes without salt. Other foods Unsalted popcorn and pretzels. Fat-free sweets. The items listed above may not be a complete list of foods and beverages you can eat. Contact a dietitian for more information. What foods should I avoid? Fruits Canned fruit in a light or heavy syrup. Fried fruit. Fruit in cream or butter sauce. Vegetables Creamed or fried vegetables. Vegetables in a cheese sauce. Regular canned vegetables (not low-sodium or reduced-sodium). Regular canned tomato sauce and paste (not low-sodium or reduced-sodium). Regular tomato and vegetable juice (not low-sodium or reduced-sodium). Angie Fava. Olives. Grains Baked goods made with fat, such as croissants, muffins, or some breads. Dry pasta or rice meal packs. Meats and other proteins Fatty cuts of meat. Ribs. Fried meat. Berniece Salines. Bologna, salami, and other precooked or cured meats, such as sausages or meat loaves. Fat from the back of a pig (fatback). Bratwurst. Salted nuts and seeds. Canned beans with added salt. Canned or smoked fish.  Whole eggs or egg yolks. Chicken or [place] with skin. Dairy Whole or 2% milk, cream, and half-and-half. Whole or full-fat cream cheese. Whole-fat or sweetened yogurt. Full-fat cheese. Nondairy creamers. Whipped toppings. Processed cheese and cheese spreads. Fats and oils Butter. Stick margarine. Lard. Shortening. Ghee. Bacon fat. Tropical oils, such as coconut, palm kernel, or palm oil. Seasonings and condiments Onion salt, garlic salt, seasoned salt, table salt, and sea salt. Worcestershire sauce. Tartar sauce. Barbecue sauce. Teriyaki sauce. Soy sauce, including reduced-sodium. Steak sauce. Canned and packaged gravies. Fish sauce. Oyster sauce. Cocktail sauce. Store-bought horseradish. Ketchup. Mustard. Meat flavorings and tenderizers. Bouillon cubes.  Hot sauces. Pre-made or packaged marinades. Pre-made or packaged taco seasonings. Relishes. Regular salad dressings. Other foods Salted popcorn and pretzels. The items listed above may not be a complete list of foods and beverages you should avoid. Contact a dietitian for more information. Where to find more information  National Heart, Lung, and Blood Institute: https://wilson-eaton.com/  American Heart Association: www.heart.org  Academy of Nutrition and Dietetics: www.eatright.[place]: www.kidney.org Summary  The DASH eating plan is a healthy eating plan that has been shown to reduce high blood pressure (hypertension). It may also reduce your risk for type 2 diabetes, heart disease, and stroke.  When on the DASH eating plan, aim to eat more fresh fruits and vegetables, whole grains, lean proteins, low-fat dairy, and heart-healthy fats.  With the DASH eating plan, you should limit salt (sodium) intake to 2,300 mg a day. If you have hypertension, you may need to reduce your sodium intake to 1,500 mg a day.  Work with your health care provider or dietitian to adjust your eating plan to your individual calorie needs. This information is not intended to replace advice given to you by your health care provider. Make sure you discuss any questions you have with your health care provider. Document Revised: 06/11/2019 Document Reviewed: 06/11/2019 Elsevier Patient Education  2021 [employer].

## 2020-11-23 NOTE — Progress Notes (Signed)
   SUBJECTIVE:   CHIEF COMPLAINT / HPI: f/u HTN, abdominal pain  HTN- adherent with new BP medication. First 3 days on medication she felt that she had low energy but has now recovered to feeling back to baseline while on it. She has been taking in the morning and wants to know best time of day to take it.  Abdominal pain- improved and almost resolved with lifestyle changes and treatment of blood pressure. Patient is very happy. H pylori stool Ag result negative.   HLD- adherent with atorvastatin and denies negative side effects.   Pre-diabetes- increased exercise and diet changes.  OBJECTIVE:   BP 130/84   Pulse 82   Ht 5\' 7"  (1.702 m)   Wt 207 lb 9.6 oz (94.2 kg)   LMP 09/19/2010   SpO2 99%   BMI 32.51 kg/m   Physical Exam Vitals and nursing note reviewed.  Constitutional:      General: She is not in acute distress.    Appearance: Normal appearance. She is not ill-appearing or toxic-appearing.  Pulmonary:     Effort: Pulmonary effort is normal.  Chest:     Chest wall: Tenderness (to palpation of left ribs (approximately T10) laterally mid axillary line. no rash.) present.  Skin:    Capillary Refill: Capillary refill takes less than 2 seconds.  Neurological:     General: No focal deficit present.     Mental Status: She is alert and oriented to person, place, and time.  Psychiatric:        Mood and Affect: Mood normal.        Behavior: Behavior normal.    ASSESSMENT/PLAN:   Pre-diabetes Implementing increased activity and diet modification Dash diet information provided today  Epigastric abdominal pain H pylori stool Ag neg Symptoms significantly improved.  Recommended topical voltaren gel PRN  HLD (hyperlipidemia) Tolerating atorvastatin well.  Continue therapy and recheck lipid panel ~4-6 months  HTN (hypertension) Tolerating losartan/HCTZ 50-12.5 well Discussed that OK to take meds at bedtime and may even be beneficial so patient wants to switch  timing Well controlled today Repeat BMP to check for electrolyte disturbances If all is well, continue current therapy and follow up ~6 months     Alexandra White

## 2020-11-24 ENCOUNTER — Encounter: Payer: Self-pay | Admitting: Student in an Organized Health Care Education/Training Program

## 2020-11-24 LAB — BASIC METABOLIC PANEL
BUN/Creatinine Ratio: 20 (ref 12–28)
BUN: 17 mg/dL (ref 8–27)
CO2: 23 mmol/L (ref 20–29)
Calcium: 9.3 mg/dL (ref 8.7–10.3)
Chloride: 101 mmol/L (ref 96–106)
Creatinine, Ser: 0.86 mg/dL (ref 0.57–1.00)
Glucose: 105 mg/dL — ABNORMAL HIGH (ref 65–99)
Potassium: 4.3 mmol/L (ref 3.5–5.2)
Sodium: 139 mmol/L (ref 134–144)
eGFR: 76 mL/min/{1.73_m2} (ref >59)

## 2020-11-24 NOTE — Assessment & Plan Note (Signed)
Tolerating atorvastatin well.  Continue therapy and recheck lipid panel ~4-6 months

## 2020-11-24 NOTE — Assessment & Plan Note (Signed)
Implementing increased activity and diet modification Dash diet information provided today

## 2020-11-24 NOTE — Assessment & Plan Note (Signed)
H pylori stool Ag neg Symptoms significantly improved.  Recommended topical voltaren gel PRN

## 2020-11-24 NOTE — Assessment & Plan Note (Signed)
Tolerating losartan/HCTZ 50-12.5 well Discussed that OK to take meds at bedtime and may even be beneficial so patient wants to switch timing Well controlled today Repeat BMP to check for electrolyte disturbances If all is well, continue current therapy and follow up ~6 months

## 2020-11-26 ENCOUNTER — Emergency Department (HOSPITAL_COMMUNITY)
Admission: EM | Admit: 2020-11-26 | Discharge: 2020-11-26 | Disposition: A | Discharge status: Home / Self Care | Payer: 59 | Attending: Emergency Medicine | Admitting: Emergency Medicine

## 2020-11-26 ENCOUNTER — Encounter (HOSPITAL_COMMUNITY): Payer: Self-pay | Admitting: Emergency Medicine

## 2020-11-26 DIAGNOSIS — J4520 Mild intermittent asthma, uncomplicated: Secondary | ICD-10-CM | POA: Insufficient documentation

## 2020-11-26 DIAGNOSIS — Z7722 Contact with and (suspected) exposure to environmental tobacco smoke (acute) (chronic): Secondary | ICD-10-CM | POA: Insufficient documentation

## 2020-11-26 DIAGNOSIS — I10 Essential (primary) hypertension: Secondary | ICD-10-CM | POA: Insufficient documentation

## 2020-11-26 DIAGNOSIS — R030 Elevated blood-pressure reading, without diagnosis of hypertension: Secondary | ICD-10-CM | POA: Diagnosis present

## 2020-11-26 DIAGNOSIS — Z79899 Other long term (current) drug therapy: Secondary | ICD-10-CM | POA: Diagnosis not present

## 2020-11-26 MED ORDER — AMLODIPINE BESYLATE 5 MG PO TABS: 5.0000 mg | ORAL_TABLET | Freq: Every day | ORAL | 0 refills | Status: DC

## 2020-11-26 MED ORDER — AMLODIPINE BESYLATE 5 MG PO TABS
5.0000 mg | ORAL_TABLET | Freq: Once | ORAL | Status: AC
  Administered 2020-11-26: 5 mg via ORAL
  Filled 2020-11-26: qty 1

## 2020-11-26 NOTE — ED Provider Notes (Signed)
[facility] DEPT Provider Note   CSN: [id number] Arrival date & time: 11/26/20  1719     History Chief Complaint  Patient presents with  . Hypertension    Alexandra White is a 65 y.o. female.  Patient presents ER chief complaint of elevated blood pressure.  She states that she was at [facility] when she felt the onset of what she describes as a "mild headache."  She decided check her blood pressure there and found that it was 270 systolic.  She became concerned and decided come to the ER.  She denies any chest pain or abdominal pain.  No fever no cough no vomiting or diarrhea.  She has a history of hypertension, recently started on blood pressure medicine approximately 2 weeks ago which she has been taking daily.  She states by the time she arrived to the ER her headache is resolved and she now feels "fine."        Past Medical History:  Diagnosis Date  . HTN (hypertension) 09/20/2009   Qualifier: Diagnosis of  By: Buelah Manis MD, Lonell Grandchild      Patient Active Problem List   Diagnosis Date Noted  . Pre-syncope 01/06/2020  . Sinus bradycardia 01/06/2020  . Blood pressure elevated without history of HTN 01/06/2020  . Constipation 12/01/2019  . Epigastric abdominal pain 11/18/2019  . Insomnia 07/16/2019  . Fatigue 07/16/2019  . Pre-diabetes 07/16/2019  . Cancer screening 03/11/2019  . Healthcare maintenance 08/23/2015  . Obesity 09/08/2012  . ASTHMA, INTERMITTENT 04/23/2010  . HTN (hypertension) 09/20/2009  . HLD (hyperlipidemia) 02/24/2008    Past Surgical History:  Procedure Laterality Date  . CESAREAN SECTION    . MULTIPLE TOOTH EXTRACTIONS       OB History   No obstetric history on file.     Family History  Problem Relation Age of Onset  . Heart disease Father   . Heart disease Brother   . Heart disease Sister   . Breast cancer Sister   . Heart disease Sister   . Pancreatic cancer Sister     Social History   Tobacco Use  . Smoking  status: Passive Smoke Exposure - Never Smoker  . Smokeless tobacco: Never Used  Vaping Use  . Vaping Use: Never used  Substance Use Topics  . Alcohol use: No  . Drug use: Not Currently    Home Medications Prior to Admission medications   Medication Sig Start Date End Date Taking? Authorizing Provider  acetaminophen (TYLENOL) 500 MG tablet Take 1,000 mg by mouth every 6 (six) hours as needed.    [provider]  acetaminophen-codeine (TYLENOL #3) 300-30 MG tablet Take 1 tablet by mouth every 4 (four) hours as needed for moderate pain.    [provider]  atorvastatin (LIPITOR) 40 MG tablet Take 1 tablet (40 mg total) by mouth daily. 11/16/20   Doristine Mango L, DO  Bismuth 262 MG CHEW Chew 262 mg by mouth in the morning, at noon, in the evening, and at bedtime. Patient not taking: No sig reported 01/13/20   Thornton Park, MD  cyclobenzaprine (FLEXERIL) 10 MG tablet Take 1 tablet (10 mg total) by mouth 2 (two) times daily as needed for muscle spasms. 08/24/20   Marcello Fennel, PA-C  dicyclomine (BENTYL) 10 MG capsule Take 1 capsule (10 mg total) by mouth every 8 (eight) hours as needed for spasms. Patient not taking: No sig reported 12/01/19   Noralyn Pick, NP  famotidine (PEPCID) 20 MG  tablet Take 1 tablet (20 mg total) by mouth 2 (two) times daily. Patient not taking: No sig reported 11/18/19   Meccariello, Bernita Raisin, DO  ibuprofen (ADVIL) 400 MG tablet Take 1 tablet (400 mg total) by mouth every 6 (six) hours as needed. Patient not taking: No sig reported 04/27/20   Lacretia Leigh, MD  losartan-hydrochlorothiazide [facility]) 50-12.5 MG tablet Take 1 tablet by mouth daily. 11/15/20   Ouida Sills, Chelsey L, DO  methocarbamol (ROBAXIN-750) 750 MG tablet Take 1 tablet (750 mg total) by mouth 4 (four) times daily. Patient not taking: No sig reported 04/27/20   Lacretia Leigh, MD  metroNIDAZOLE (FLAGYL) 500 MG tablet Take 1 tablet (500 mg total) by mouth 4 (four)  times daily. Patient not taking: No sig reported 01/13/20   Thornton Park, MD  pantoprazole (PROTONIX) 40 MG tablet Take 1 tablet (40 mg total) by mouth 2 (two) times daily. Patient not taking: No sig reported 01/13/20   Thornton Park, MD  tetracycline (SUMYCIN) 500 MG capsule Take 1 capsule (500 mg total) by mouth 4 (four) times daily. Patient not taking: No sig reported 01/13/20   Thornton Park, MD    Allergies    Amoxicillin and Penicillins  Review of Systems   Review of Systems  Constitutional: Negative for fever.  HENT: Negative for ear pain.   Eyes: Negative for pain.  Respiratory: Negative for cough.   Cardiovascular: Negative for chest pain.  Gastrointestinal: Negative for abdominal pain.  Genitourinary: Negative for flank pain.  Musculoskeletal: Negative for back pain.  Skin: Negative for rash.  Neurological: Positive for headaches.    Physical Exam Updated Vital Signs BP (!) 155/113   Pulse 80   Temp 97.9 F (36.6 C) (Oral)   Resp 18   LMP 09/19/2010   SpO2 100%   Physical Exam Constitutional:      General: She is not in acute distress.    Appearance: Normal appearance.  HENT:     Head: Normocephalic.     Nose: Nose normal.  Eyes:     Extraocular Movements: Extraocular movements intact.  Cardiovascular:     Rate and Rhythm: Normal rate.  Pulmonary:     Effort: Pulmonary effort is normal.  Musculoskeletal:        General: Normal range of motion.     Cervical back: Normal range of motion.  Neurological:     General: No focal deficit present.     Mental Status: She is alert and oriented to person, place, and time. Mental status is at baseline.     Cranial Nerves: No cranial nerve deficit.     Motor: No weakness.     Gait: Gait normal.     ED Results / Procedures / Treatments   Labs (all labs ordered are listed, but only abnormal results are displayed) Labs Reviewed - No data to display  EKG None  Radiology No results  found.  Procedures Procedures   Medications Ordered in ED Medications  amLODipine (NORVASC) tablet 5 mg (has no administration in time range)    ED Course  I have reviewed the triage vital signs and the nursing notes.  Pertinent labs & imaging results that were available during my care of the patient were reviewed by me and considered in my medical decision making (see chart for details).    MDM Rules/Calculators/A&P                          Repeat  blood pressure here in the ER appears normal about 55 systolic mildly elevated.  No additional labs or studies are done, she is closely followed by her primary care doctors and just saw them a few days ago.  Patient is requesting additional blood pressure medicines I discussed risks and benefits of starting more medications, advised her to follow-up with her primary care doctor.  Given a dose here today in a short prescription, but advised her to follow-up with her primary to see if this is a medicine they want her to stick with that they want to continue to monitor her.   Final Clinical Impression(s) / ED Diagnoses Final diagnoses:  Hypertension, unspecified type    Rx / DC Orders ED Discharge Orders    None       Luna Fuse, MD 11/26/20 1754

## 2020-11-26 NOTE — ED Triage Notes (Signed)
[facility] reports [facility] with 185[facility]105 reading. States just started BP medications x2 weeks ago. Reports mild headache.

## 2020-11-26 NOTE — Discharge Instructions (Addendum)
Call your primary care doctor or specialist as discussed in the next 2-3 days.   Return immediately back to the ER if:  Your symptoms worsen within the next 12-24 hours. You develop new symptoms such as new fevers, persistent vomiting, new pain, shortness of breath, or new weakness or numbness, or if you have any other concerns.  

## 2020-12-07 ENCOUNTER — Other Ambulatory Visit: Payer: Self-pay | Admitting: Student in an Organized Health Care Education/Training Program

## 2020-12-07 DIAGNOSIS — I10 Essential (primary) hypertension: Secondary | ICD-10-CM

## 2020-12-07 MED ORDER — LOSARTAN POTASSIUM-HCTZ 50-12.5 MG PO TABS: 1.0000 | ORAL_TABLET | Freq: Every day | ORAL | 0 refills | Status: DC

## 2020-12-15 NOTE — Addendum Note (Signed)
Addended by: Valerie Roys on: 12/15/2020 10:05 AM   Modules accepted: Orders

## 2020-12-15 NOTE — Progress Notes (Signed)
Addendum created to add PA information to echo order.  Avalon Coppinger,[name]

## 2020-12-21 ENCOUNTER — Other Ambulatory Visit: Payer: Self-pay

## 2020-12-21 ENCOUNTER — Encounter: Payer: Self-pay | Admitting: Student in an Organized Health Care Education/Training Program

## 2020-12-21 ENCOUNTER — Ambulatory Visit (INDEPENDENT_AMBULATORY_CARE_PROVIDER_SITE_OTHER): Payer: 59 | Admitting: Student in an Organized Health Care Education/Training Program

## 2020-12-21 DIAGNOSIS — G4700 Insomnia, unspecified: Secondary | ICD-10-CM

## 2020-12-21 DIAGNOSIS — I10 Essential (primary) hypertension: Secondary | ICD-10-CM | POA: Diagnosis not present

## 2020-12-21 NOTE — Progress Notes (Signed)
    SUBJECTIVE:   CHIEF COMPLAINT / HPI: BP f/u  Since our last visit- patient has had significant death in the family and ED visit for asymptomatic HTN. ED added amlodipine to her regimen for systolics around 017P and recommended follow up with PCP. She has been taking her hyzaar and amlodipine daily as directed. Checking blood pressure randomly throughout days and noticed that it is sometimes higher up to 140s in the afternoons. Takes her medications at nighttime. Denies any negative side effects of medication or symptoms of hyper/hypotension.   Insomnia- chronic, since she was a child. Has not tried any OTC medications. Describes sleep latency due to a "racing mind". She does watch TV in bed and has a variable nighttime schedule.   OBJECTIVE:   BP 124/80   Pulse 71   Wt 205 lb 6.4 oz (93.2 kg)   LMP 09/19/2010   SpO2 98%   BMI 32.17 kg/m   Physical Exam Vitals and nursing note reviewed.  Constitutional:      General: She is not in acute distress.    Appearance: Normal appearance. She is not ill-appearing, toxic-appearing or diaphoretic.  Cardiovascular:     Rate and Rhythm: Normal rate and regular rhythm.     Pulses: Normal pulses.  Pulmonary:     Effort: Pulmonary effort is normal.     Breath sounds: Normal breath sounds.  Skin:    Capillary Refill: Capillary refill takes less than 2 seconds.  Neurological:     General: No focal deficit present.     Mental Status: She is alert and oriented to person, place, and time.  Psychiatric:        Mood and Affect: Mood normal.        Behavior: Behavior normal.    ASSESSMENT/PLAN:   HTN (hypertension) Discussed blood pressure "normals" with patient and that it fluctuates throughout the day but to look out for the extremes of the spectrum. Reviewed proper BP measurement techniques at home to get accurate readings.  Patient was well controlled on single agent at last visit and today with the additional amlodipine. Today, we are  discontinuing the amlodipine. Patient will monitor her blood pressures at home and if seeing SBP>130 or DBP>90, will come back in to consider increase dose of her hyzaar (currently 50-12.5) preferably over starting a new agent. Repeat BMP at follow up.   Insomnia Chronic. Sleep latency primarily with many sleep hygiene issues. Recommended bedtime routine/schedule and trial of meditation. Can also consider OTC melatonin supplement.  Return if needing further assistance.      [place]

## 2020-12-21 NOTE — Patient Instructions (Signed)
It was a pleasure to see you today!  To summarize our discussion for this visit:  I'm glad to hear that you are feeling well today and we were able to touch base about your blood pressures.   Please discontinue the amlodipine and continue to check your blood pressures at home like we talked about and let me know if you're seeing blood pressures over 140 regularly. We can talk about increasing your hyzaar dose if needed.   I hope you and your family are healing and finding peace after losing your niece. I'm sorry for your loss.   Some additional health maintenance measures we should update are: Health Maintenance Due  Topic Date Due  . COVID-19 Vaccine (1) Never done  . TETANUS/TDAP  Never done  . MAMMOGRAM  Never done  . Zoster Vaccines- Shingrix (1 of 2) Never done  .    Please return to our clinic to see me in about 3 months.  Call the clinic at [phone] if your symptoms worsen or you have any concerns.   Thank you for allowing me to take part in your care,  Dr. Doristine Mango

## 2020-12-25 NOTE — Assessment & Plan Note (Signed)
Discussed blood pressure "normals" with patient and that it fluctuates throughout the day but to look out for the extremes of the spectrum. Reviewed proper BP measurement techniques at home to get accurate readings.  Patient was well controlled on single agent at last visit and today with the additional amlodipine. Today, we are discontinuing the amlodipine. Patient will monitor her blood pressures at home and if seeing SBP>130 or DBP>90, will come back in to consider increase dose of her hyzaar (currently 50-12.5) preferably over starting a new agent. Repeat BMP at follow up.

## 2020-12-25 NOTE — Assessment & Plan Note (Signed)
Chronic. Sleep latency primarily with many sleep hygiene issues. Recommended bedtime routine/schedule and trial of meditation. Can also consider OTC melatonin supplement.  Return if needing further assistance.

## 2020-12-28 ENCOUNTER — Other Ambulatory Visit: Payer: Self-pay

## 2020-12-28 ENCOUNTER — Ambulatory Visit (HOSPITAL_COMMUNITY)
Admission: RE | Admit: 2020-12-28 | Discharge: 2020-12-28 | Disposition: A | Discharge status: Home / Self Care | Payer: 59 | Source: Ambulatory Visit | Attending: Family Medicine | Admitting: Family Medicine

## 2020-12-28 DIAGNOSIS — I10 Essential (primary) hypertension: Secondary | ICD-10-CM | POA: Insufficient documentation

## 2020-12-28 DIAGNOSIS — R011 Cardiac murmur, unspecified: Secondary | ICD-10-CM | POA: Insufficient documentation

## 2020-12-28 NOTE — Progress Notes (Signed)
  Echocardiogram 2D Echocardiogram has been performed.  Jennette Dubin 12/28/2020, 2:39 PM

## 2020-12-29 LAB — ECHOCARDIOGRAM COMPLETE
Area-P 1/2: 3.37 cm2
S' Lateral: 2.7 cm

## 2020-12-31 ENCOUNTER — Other Ambulatory Visit: Payer: Self-pay | Admitting: Student in an Organized Health Care Education/Training Program

## 2020-12-31 DIAGNOSIS — I10 Essential (primary) hypertension: Secondary | ICD-10-CM

## 2021-01-16 ENCOUNTER — Ambulatory Visit: Payer: 59

## 2021-01-19 ENCOUNTER — Ambulatory Visit
Admission: RE | Admit: 2021-01-19 | Discharge: 2021-01-19 | Disposition: A | Discharge status: Home / Self Care | Payer: 59 | Source: Ambulatory Visit | Attending: Family Medicine | Admitting: Family Medicine

## 2021-01-19 DIAGNOSIS — Z1231 Encounter for screening mammogram for malignant neoplasm of breast: Secondary | ICD-10-CM

## 2021-01-30 ENCOUNTER — Ambulatory Visit: Payer: 59

## 2021-01-30 NOTE — Progress Notes (Deleted)
    SUBJECTIVE:   CHIEF COMPLAINT / HPI:   At last visit with Dr. Doristine Mango (former PCP), BP was 124/80 and amlodipine was discontinued. She was maintained on losartan-HCTZ 50-12.5 mg.  PERTINENT  PMH / PSH: HTN  OBJECTIVE:   LMP 09/19/2010   General: ***, NAD CV: RRR, no murmurs*** Pulm: CTAB, no wheezes or rales  ASSESSMENT/PLAN:   No problem-specific Assessment & Plan notes found for this encounter.     Zola Button, MD [place]   {    This will disappear when note is signed, click to select method of visit    :1}

## 2021-02-01 ENCOUNTER — Other Ambulatory Visit: Payer: Self-pay

## 2021-02-01 ENCOUNTER — Encounter (HOSPITAL_COMMUNITY): Payer: Self-pay

## 2021-02-01 ENCOUNTER — Emergency Department (HOSPITAL_COMMUNITY)
Admission: EM | Admit: 2021-02-01 | Discharge: 2021-02-01 | Disposition: A | Discharge status: Home / Self Care | Payer: 59 | Attending: Emergency Medicine | Admitting: Emergency Medicine

## 2021-02-01 ENCOUNTER — Emergency Department (HOSPITAL_COMMUNITY): Payer: 59

## 2021-02-01 DIAGNOSIS — G4486 Cervicogenic headache: Secondary | ICD-10-CM | POA: Diagnosis not present

## 2021-02-01 DIAGNOSIS — I10 Essential (primary) hypertension: Secondary | ICD-10-CM | POA: Diagnosis not present

## 2021-02-01 DIAGNOSIS — M542 Cervicalgia: Secondary | ICD-10-CM

## 2021-02-01 DIAGNOSIS — Z7722 Contact with and (suspected) exposure to environmental tobacco smoke (acute) (chronic): Secondary | ICD-10-CM | POA: Insufficient documentation

## 2021-02-01 LAB — COMPREHENSIVE METABOLIC PANEL
ALT: 24 U/L (ref 0–44)
AST: 21 U/L (ref 15–41)
Albumin: 4.3 g/dL (ref 3.5–5.0)
Alkaline Phosphatase: 71 U/L (ref 38–126)
Anion gap: 8 (ref 5–15)
BUN: 11 mg/dL (ref 8–23)
CO2: 27 mmol/L (ref 22–32)
Calcium: 9.6 mg/dL (ref 8.9–10.3)
Chloride: 104 mmol/L (ref 98–111)
Creatinine, Ser: 0.75 mg/dL (ref 0.44–1.00)
GFR, Estimated: >60 mL/min (ref >60)
Glucose, Bld: 116 mg/dL — ABNORMAL HIGH (ref 70–99)
Potassium: 3.7 mmol/L (ref 3.5–5.1)
Sodium: 139 mmol/L (ref 135–145)
Total Bilirubin: 0.8 mg/dL (ref 0.3–1.2)
Total Protein: 7.3 g/dL (ref 6.5–8.1)

## 2021-02-01 LAB — CBC
HCT: 43.6 % (ref 36.0–46.0)
Hemoglobin: 13.9 g/dL (ref 12.0–15.0)
MCH: 28.1 pg (ref 26.0–34.0)
MCHC: 31.9 g/dL (ref 30.0–36.0)
MCV: 88.1 fL (ref 80.0–100.0)
Platelets: 268 10*3/uL (ref 150–400)
RBC: 4.95 MIL/uL (ref 3.87–5.11)
RDW: 14 % (ref 11.5–15.5)
WBC: 4.3 10*3/uL (ref 4.0–10.5)
nRBC: 0 % (ref 0.0–0.2)

## 2021-02-01 MED ORDER — DIPHENHYDRAMINE HCL 50 MG/ML IJ SOLN
25.0000 mg | Freq: Once | INTRAMUSCULAR | Status: AC
  Administered 2021-02-01: 25 mg via INTRAVENOUS
  Filled 2021-02-01: qty 1

## 2021-02-01 MED ORDER — PROCHLORPERAZINE EDISYLATE 10 MG/2ML IJ SOLN
5.0000 mg | Freq: Once | INTRAMUSCULAR | Status: AC
  Administered 2021-02-01: 5 mg via INTRAVENOUS
  Filled 2021-02-01: qty 2

## 2021-02-01 MED ORDER — IOHEXOL 350 MG/ML SOLN
100.0000 mL | Freq: Once | INTRAVENOUS | Status: AC | PRN
  Administered 2021-02-01: 100 mL via INTRAVENOUS

## 2021-02-01 MED ORDER — SODIUM CHLORIDE (PF) 0.9 % IJ SOLN
INTRAMUSCULAR | Status: AC
  Filled 2021-02-01: qty 50

## 2021-02-01 MED ORDER — SODIUM CHLORIDE 0.9 % IV BOLUS
500.0000 mL | Freq: Once | INTRAVENOUS | Status: AC
  Administered 2021-02-01: 500 mL via INTRAVENOUS

## 2021-02-01 NOTE — Discharge Instructions (Addendum)
With today's ED evaluation for headache and neck pain it is very important to follow-up with your physician for appropriate ongoing outpatient management.  Return here for concerning changes in your condition.

## 2021-02-01 NOTE — ED Provider Notes (Signed)
[facility] Emergency Department Provider Note MRN:  [id number]  Arrival date & time: 02/01/21     Chief Complaint   Headache   History of Present Illness   Alexandra White is a 65 y.o. year-old female with a history of hypertension presenting to the ED with chief complaint of headache.  Sudden onset headache yesterday, severe, does not normally get headaches, has never had a headache this bad before.  Persistent since then, associated with some neck stiffness and discomfort, nausea but no vomiting.  Denies any numbness or weakness to the arms or legs, no chest pain or abdominal pain, no shortness of breath, no vision changes.  Review of Systems  A complete 10 system review of systems was obtained and all systems are negative except as noted in the HPI and PMH.   Patient's Health History    Past Medical History:  Diagnosis Date   HTN (hypertension) 09/20/2009   Qualifier: Diagnosis of  By: Buelah Manis MD, Lonell Grandchild      Past Surgical History:  Procedure Laterality Date   CESAREAN SECTION     MULTIPLE TOOTH EXTRACTIONS      Family History  Problem Relation Age of Onset   Heart disease Father    Heart disease Brother    Heart disease Sister    Breast cancer Sister    Heart disease Sister    Pancreatic cancer Sister     Social History   Socioeconomic History   Marital status: Married    Spouse name: Not on file   Number of children: Not on file   Years of education: Not on file   Highest education level: Not on file  Occupational History   Not on file  Tobacco Use   Smoking status: Passive Smoke Exposure - Never Smoker   Smokeless tobacco: Never  Vaping Use   Vaping Use: Never used  Substance and Sexual Activity   Alcohol use: No   Drug use: Not Currently   Sexual activity: Yes  Other Topics Concern   Not on file  Social History Narrative   Not on file   Social Determinants of Health   Financial Resource Strain: Not on file  Food  Insecurity: Not on file  Transportation Needs: Not on file  Physical Activity: Not on file  Stress: Not on file  Social Connections: Not on file  Intimate Partner Violence: Not on file     Physical Exam   Vitals:   02/01/21 0641 02/01/21 0730  BP: (!) 126/91 121/90  Pulse: 68 (!) 56  Resp: 18 19  Temp:    SpO2: 100% 97%    CONSTITUTIONAL: Well-appearing, NAD NEURO:  Alert and oriented x 3, normal and symmetric strength and sensation, normal coordination, normal speech EYES:  eyes equal and reactive ENT/NECK:  no LAD, no JVD CARDIO: Regular rate, well-perfused, normal S1 and S2 PULM:  CTAB no wheezing or rhonchi GI/GU:  normal bowel sounds, non-distended, non-tender MSK/SPINE:  No gross deformities, no edema SKIN:  no rash, atraumatic PSYCH:  Appropriate speech and behavior  *Additional and/or pertinent findings included in MDM below  Diagnostic and Interventional Summary    EKG Interpretation  Date/Time:    Ventricular Rate:    PR Interval:    QRS Duration:   QT Interval:    QTC Calculation:   R Axis:     Text Interpretation:         Labs Reviewed  COMPREHENSIVE METABOLIC PANEL - Abnormal; Notable for the following  components:      Result Value   Glucose, Bld 116 (*)    All other components within normal limits  CBC    CT Angio Head W or Wo Contrast    (Results Pending)  CT Angio Neck W and/or Wo Contrast    (Results Pending)  CT C-SPINE NO CHARGE    (Results Pending)    Medications  sodium chloride (PF) 0.9 % injection (has no administration in time range)  diphenhydrAMINE (BENADRYL) injection 25 mg (25 mg Intravenous Given 02/01/21 0628)  prochlorperazine (COMPAZINE) injection 5 mg (5 mg Intravenous Given 02/01/21 8768)  sodium chloride 0.9 % bolus 500 mL (0 mLs Intravenous Stopped 02/01/21 0732)  iohexol (OMNIPAQUE) 350 MG/ML injection 100 mL (100 mLs Intravenous Contrast Given 02/01/21 1157)     Procedures  /  Critical Care Procedures  ED Course  and Medical Decision Making  I have reviewed the triage vital signs, the nursing notes, and pertinent available records from the EMR.  Listed above are laboratory and imaging tests that I personally ordered, reviewed, and interpreted and then considered in my medical decision making (see below for details).  Sudden onset headache, will need CTA to exclude subarachnoid hemorrhage.  Reassuring neurological exam at this time.  Had some neck soreness the day before the headache, could be a cervicogenic headache, providing migraine cocktail and will reassess     Awaiting CT imaging, signed out to oncoming provider at shift change.  Barth Kirks. Sedonia Small, [place] [email or web address]  Final Clinical Impressions(s) / ED Diagnoses     ICD-10-CM   1. Cervicogenic headache  G44.86     2. Neck pain  M54.2 CT C-SPINE NO CHARGE    CT C-SPINE NO CHARGE      ED Discharge Orders     None        Discharge Instructions Discussed with and Provided to Patient:   Discharge Instructions   None       Maudie Flakes, MD 02/01/21 [phone]

## 2021-02-01 NOTE — ED Notes (Signed)
Patient is in room, pleasant and cooperative. Patient complains of a headache in the back of her head rated 7/10.

## 2021-02-01 NOTE — ED Triage Notes (Addendum)
Pt arrived via pov to ED for c/o headache that started yesterday when the storm started.  Pt states the pain will not go away.  Pt reports the pain is in the back of her head and radiates down her neck and into shoulders and sometimes hurts in the front of her head.  Pt states she does not get headaches often, but this headache is a different kind of headache.  Pt also reports urinary frequency.

## 2021-02-01 NOTE — ED Provider Notes (Signed)
8:59 AM Patient in no distress.  Care of the patient assumed at signout, CT scans reviewed, no notable abnormalities.  With improvement patient discharged to follow-up with primary care.   Carmin Muskrat, MD 02/01/21 [phone]

## 2021-04-14 NOTE — Progress Notes (Deleted)
    SUBJECTIVE:   CHIEF COMPLAINT / HPI: Legs feeling tired  ***  PERTINENT  PMH / PSH: HTN  OBJECTIVE:   LMP 09/19/2010   General: ***, NAD CV: RRR, no murmurs*** Pulm: CTAB, no wheezes or rales  ASSESSMENT/PLAN:   No problem-specific Assessment & Plan notes found for this encounter.     Zola Button, MD [place]   {    This will disappear when note is signed, click to select method of visit    :1}

## 2021-04-16 ENCOUNTER — Ambulatory Visit: Payer: 59

## 2021-05-14 ENCOUNTER — Other Ambulatory Visit: Payer: Self-pay | Admitting: Student in an Organized Health Care Education/Training Program

## 2021-05-14 DIAGNOSIS — I10 Essential (primary) hypertension: Secondary | ICD-10-CM

## 2021-07-03 ENCOUNTER — Ambulatory Visit: Payer: 59

## 2021-08-02 ENCOUNTER — Ambulatory Visit (INDEPENDENT_AMBULATORY_CARE_PROVIDER_SITE_OTHER): Payer: 59 | Admitting: Family Medicine

## 2021-08-02 ENCOUNTER — Other Ambulatory Visit: Payer: Self-pay

## 2021-08-02 ENCOUNTER — Ambulatory Visit (INDEPENDENT_AMBULATORY_CARE_PROVIDER_SITE_OTHER): Payer: 59

## 2021-08-02 VITALS — BP 131/81 | HR 74 | Ht 67.0 in | Wt 211.6 lb

## 2021-08-02 DIAGNOSIS — R7303 Prediabetes: Secondary | ICD-10-CM | POA: Diagnosis not present

## 2021-08-02 DIAGNOSIS — Z23 Encounter for immunization: Secondary | ICD-10-CM

## 2021-08-02 DIAGNOSIS — L659 Nonscarring hair loss, unspecified: Secondary | ICD-10-CM

## 2021-08-02 DIAGNOSIS — R350 Frequency of micturition: Secondary | ICD-10-CM

## 2021-08-02 LAB — POCT URINALYSIS DIP (MANUAL ENTRY)
Bilirubin, UA: NEGATIVE
Blood, UA: NEGATIVE
Glucose, UA: NEGATIVE mg/dL
Ketones, POC UA: NEGATIVE mg/dL
Nitrite, UA: NEGATIVE
Protein Ur, POC: NEGATIVE mg/dL
Spec Grav, UA: 1.01 (ref 1.010–1.025)
Urobilinogen, UA: 0.2 E.U./dL
pH, UA: 6 (ref 5.0–8.0)

## 2021-08-02 LAB — POCT GLYCOSYLATED HEMOGLOBIN (HGB A1C): HbA1c, POC (prediabetic range): 6.2 % (ref 5.7–6.4)

## 2021-08-02 NOTE — Progress Notes (Signed)
° ° °  SUBJECTIVE:   CHIEF COMPLAINT / HPI:   Frequent urination She has been having nocturia more often than usual in the last 4 days.  She notes that she is getting up at least 3-4 times per night.  The only changes she is recently noted is that she does sometimes drink sweet tea a little bit closer to bedtime than she has in the past.  Thinning hair Patient also reports that she has noticed thinning of her hair, she started taking a hair supplement 1 day ago, she is been noticing hair changes for the last year or so and is little bit concerned about this.  PERTINENT  PMH / PSH: Reviewed  OBJECTIVE:   BP 131/81    Pulse 74    Ht 5\' 7"  (1.702 m)    Wt 211 lb 9.6 oz (96 kg)    LMP 09/19/2010    SpO2 98%    BMI 33.14 kg/m   Gen: well-appearing, NAD CV: RRR, no m/r/g appreciated, no peripheral edema Pulm: CTAB, no wheezes/crackles GI: soft, non-tender, non-distended Neuro: CN II: PERRL CN III, IV,VI: EOMI CV V: Normal sensation in V1, V2, V3 CVII: Symmetric smile and brow raise CN VIII: Normal hearing CN IX,X: Symmetric palate raise  CN XI: 5/5 shoulder shrug CN XII: Symmetric tongue protrusion  UE and LE strength 5/5  ASSESSMENT/PLAN:   Urinary frequency at night UA was negative for signs of acute infection, patient symptomology does not fit infection as well.  Physical exam reassuring.  A1c was measured to ensure not secondary to worsening blood sugar control, which was stable at 6.2%  At this time, feel that it is most likely related to increased fluid/caffeine intake late at night.  Discussed with patient to limit fluid intake 1 to 2 hours prior to bedtime and to avoid caffeine after 4 PM at the latest.  Thinning hair Patient mentioned concerns about thinning hair over the last year or so, she is unsure if it is related to her blood pressure medications.  Discussed with patient to make another appointment for further evaluation.  Can consider TSH if appropriate.  Consideration  for telogen effluvium as patient reports more hair loss palpation in the temporal regions. - Patient to follow-up and further discuss with PCP   Alexandra White, [place]

## 2021-08-02 NOTE — Assessment & Plan Note (Signed)
A1c today stable at 6.2%

## 2021-08-02 NOTE — Patient Instructions (Addendum)
It was so great seeing you today! Today we discussed the following:  - your urine test was negative and your A1c was 6.2 (largely unchanged), I do not think that you have any signs of infection and do not need antibiotics. Your increased frequency may be related to your fluid intake prior to bed. We recommend limiting drinking anything at least an hour before bed   - For your thinning hair, please make another appointment for further discussion about options for evaluation/lab tests.    Please make sure to bring any medications you take to your appointments. If you have any questions or concerns please call the office at [phone].

## 2021-08-05 LAB — URINE CULTURE

## 2021-09-12 ENCOUNTER — Other Ambulatory Visit: Payer: Self-pay

## 2021-09-12 ENCOUNTER — Ambulatory Visit (INDEPENDENT_AMBULATORY_CARE_PROVIDER_SITE_OTHER): Payer: 59 | Admitting: Family Medicine

## 2021-09-12 VITALS — BP 126/87 | HR 67 | Ht 67.0 in | Wt 209.8 lb

## 2021-09-12 DIAGNOSIS — J309 Allergic rhinitis, unspecified: Secondary | ICD-10-CM

## 2021-09-12 MED ORDER — LORATADINE 10 MG PO TABS: 10.0000 mg | ORAL_TABLET | Freq: Every day | ORAL | 0 refills | Status: DC

## 2021-09-12 NOTE — Patient Instructions (Signed)
Thank you for coming to see me today. It was a pleasure. Today we talked about:   Restart Claritin daily  Please follow-up with PCP as needed  If you have any questions or concerns, please do not hesitate to call the office at [phone].  Best,   Carollee Leitz, MD

## 2021-09-12 NOTE — Progress Notes (Signed)
° ° °  SUBJECTIVE:   CHIEF COMPLAINT / HPI: sinuses acting up  Patient reports increased sneezing and mild sore throat for 3 days.  Has had similar symptoms in past, usually twice a year, and used Claritin DM with relief. Cannot take this now secondary to HTN.  Denies any headaches, visual changes, difficulty swallowing, fevers, itching, shortness of breath, chest pain, sick contacts or recent travel.  Was outside doing lawn work few days ago.  PERTINENT  PMH / PSH:  GERD HTN Allergic Rhinitis  OBJECTIVE:   BP 126/87    Pulse 67    Ht 5\' 7"  (1.702 m)    Wt 209 lb 12.8 oz (95.2 kg)    LMP 09/19/2010    SpO2 100%    BMI 32.86 kg/m    General: Alert, no acute distress HEENT: TM's visible bilaterally, no bulging or erythema, posterior pharyngeal wall and tonsils without erythema, exudate or edema. No frontal or maxillary sinus tenderness.   ASSESSMENT/PLAN:   Allergic rhinitis Less likely infectious etiology given no fevers and predominantly sneezing as main symptom. Loratadine 10 mg daily If continued symptoms can restart Flonase daily. Follow up with PCP as needed     Carollee Leitz, MD [place]

## 2021-09-16 ENCOUNTER — Encounter: Payer: Self-pay | Admitting: Family Medicine

## 2021-09-16 NOTE — Assessment & Plan Note (Signed)
Less likely infectious etiology given no fevers and predominantly sneezing as main symptom. Loratadine 10 mg daily If continued symptoms can restart Flonase daily. Follow up with PCP as needed

## 2021-11-10 ENCOUNTER — Other Ambulatory Visit: Payer: Self-pay | Admitting: Student in an Organized Health Care Education/Training Program

## 2021-11-16 ENCOUNTER — Encounter: Payer: 59 | Admitting: Student

## 2021-12-18 ENCOUNTER — Encounter: Payer: 59 | Admitting: Student

## 2021-12-23 ENCOUNTER — Other Ambulatory Visit: Payer: Self-pay | Admitting: Student

## 2021-12-23 DIAGNOSIS — I10 Essential (primary) hypertension: Secondary | ICD-10-CM

## 2021-12-31 ENCOUNTER — Encounter: Payer: 59 | Admitting: Student

## 2022-01-04 IMAGING — US US ABDOMEN LIMITED
1 series · 14 of 25 positions shown · non-contrast
Comparison: None.

CLINICAL DATA: 63-year-old female with right upper quadrant
abdominal pain.

EXAM:
ULTRASOUND ABDOMEN LIMITED RIGHT UPPER QUADRANT

[Series 1: us abdomen limited · 14 of 62 slices shown]
[im 1/62]
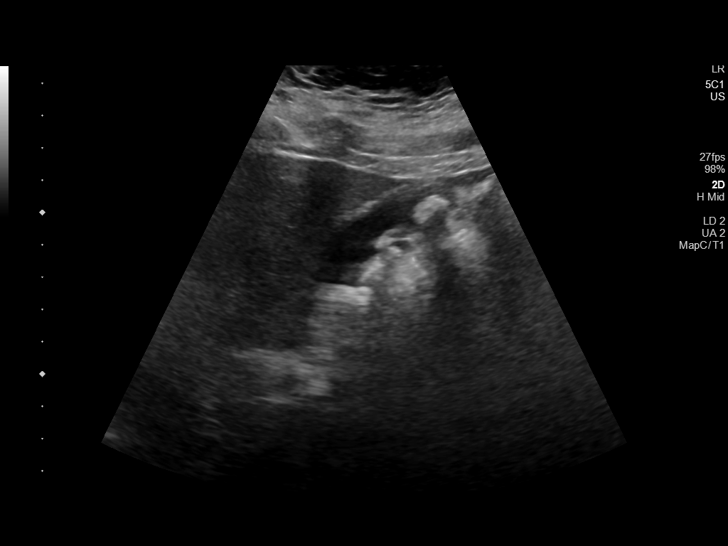
[im 6/62]
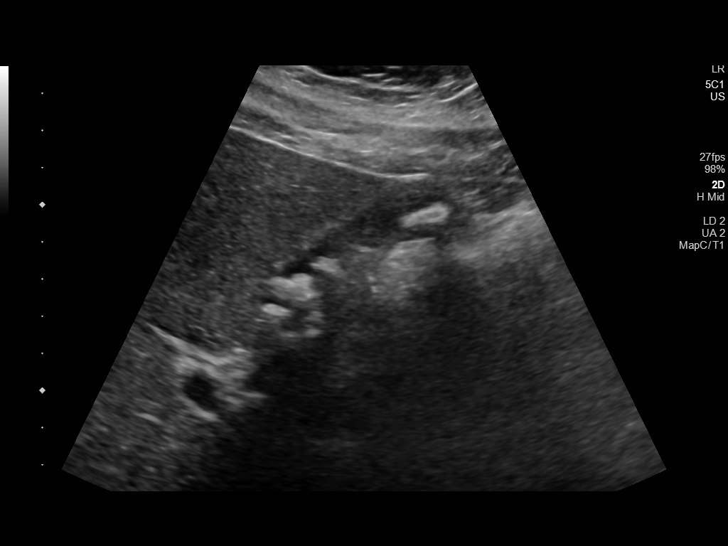
[im 11/62]
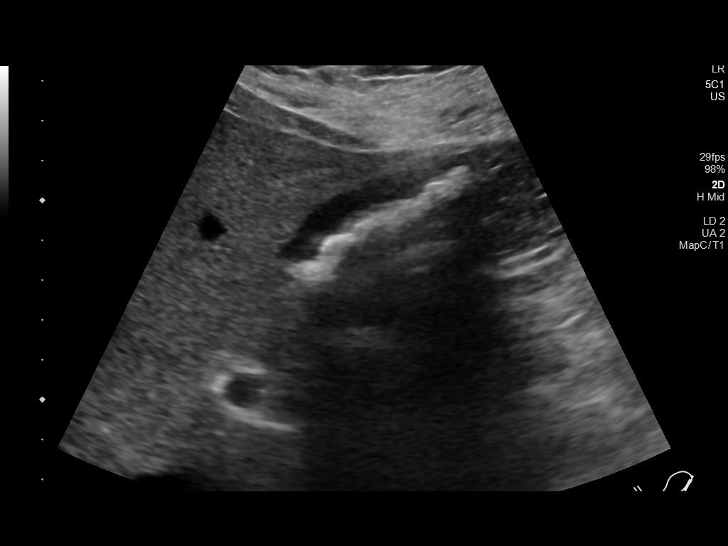
[im 16/62]
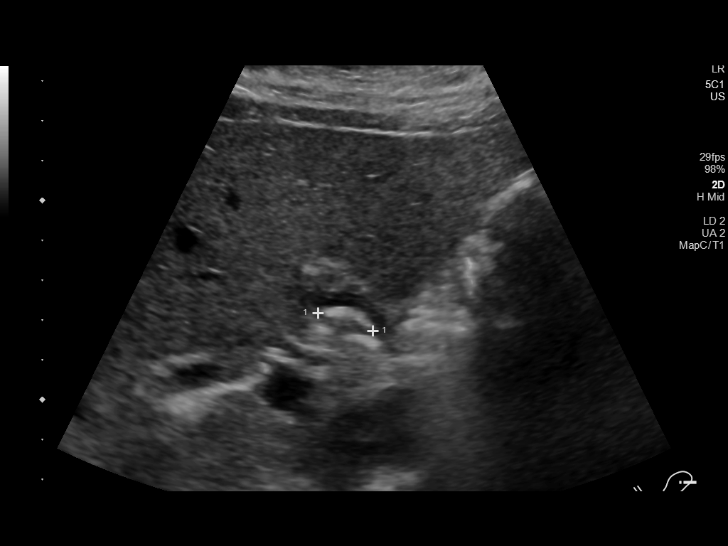
[im 21/62]
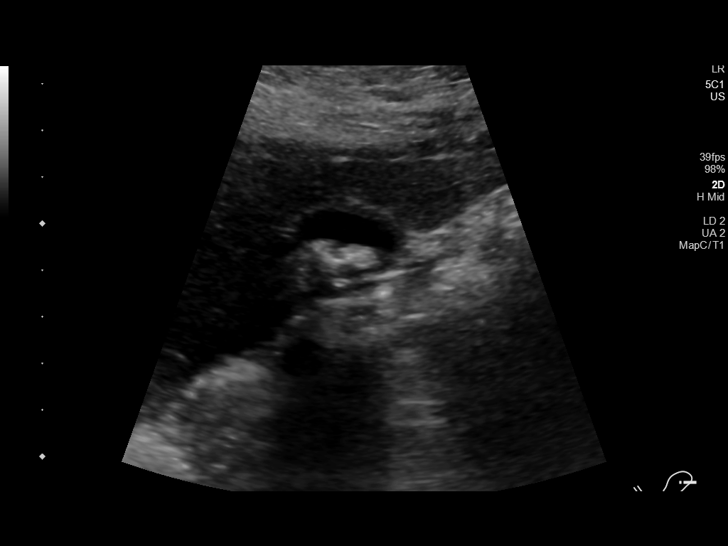
[im 23/62]
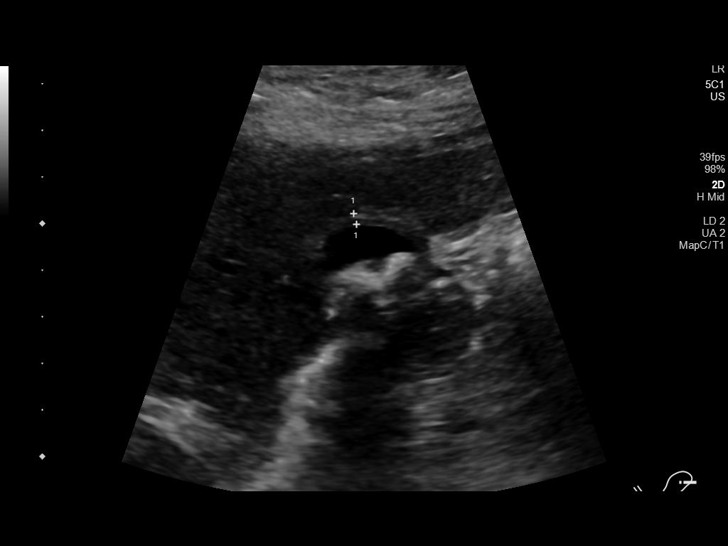
[im 28/62]
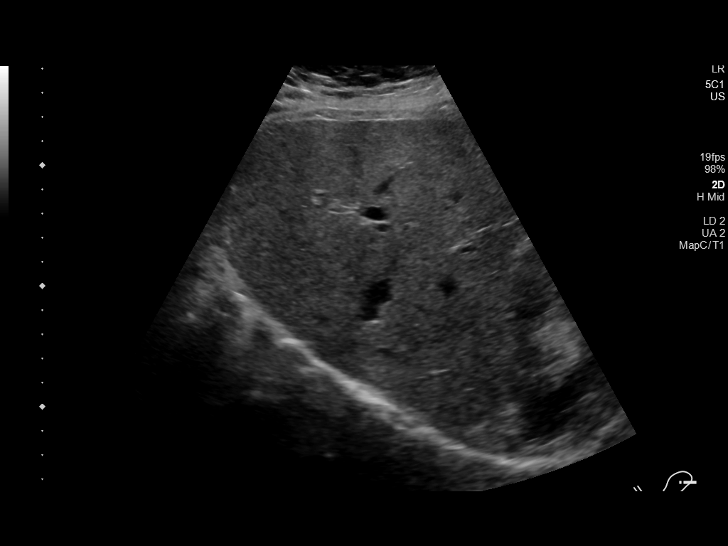
[im 34/62]
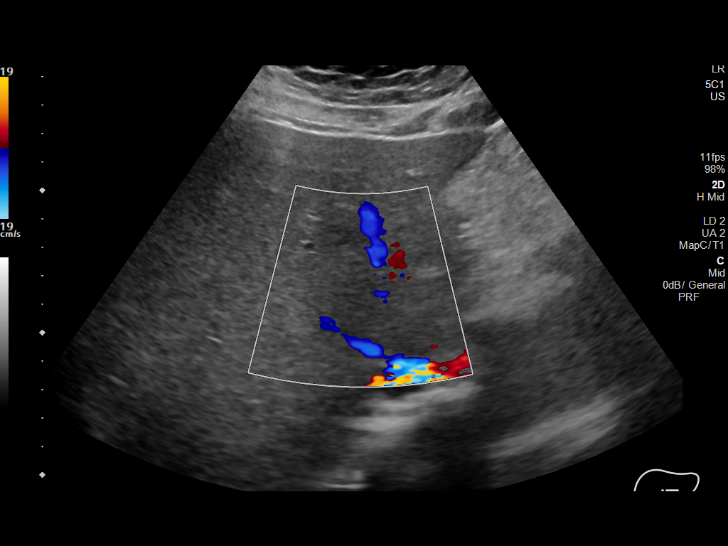
[im 39/62]
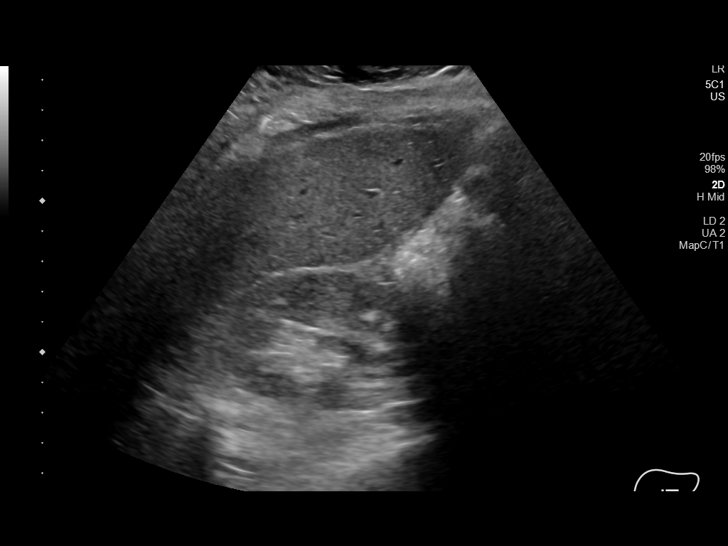
[im 41/62]
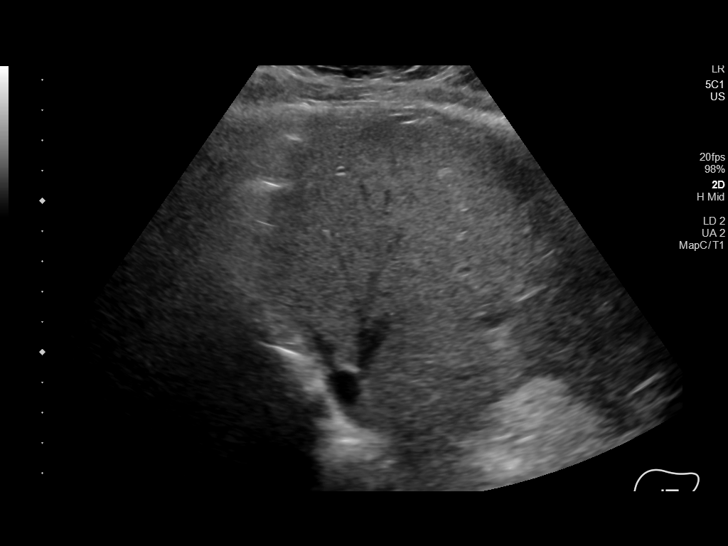
[im 46/62]
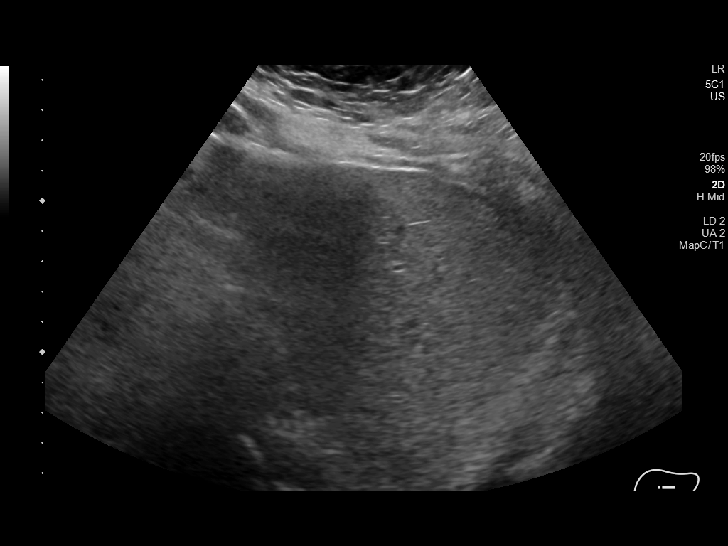
[im 51/62]
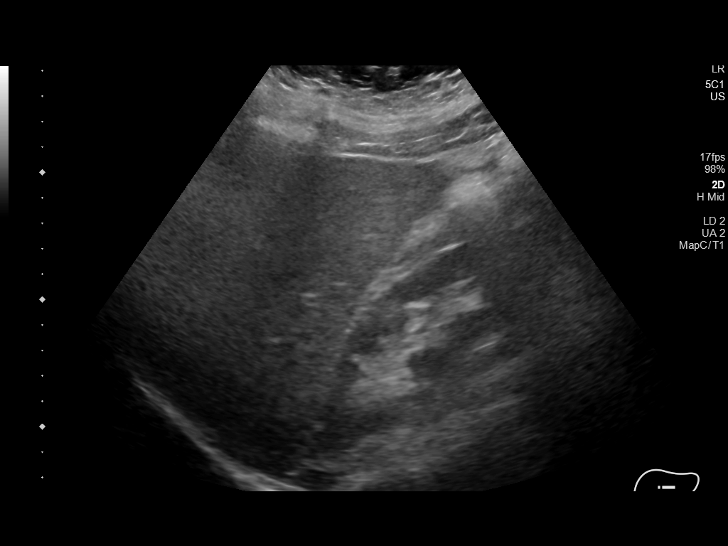
[im 56/62]
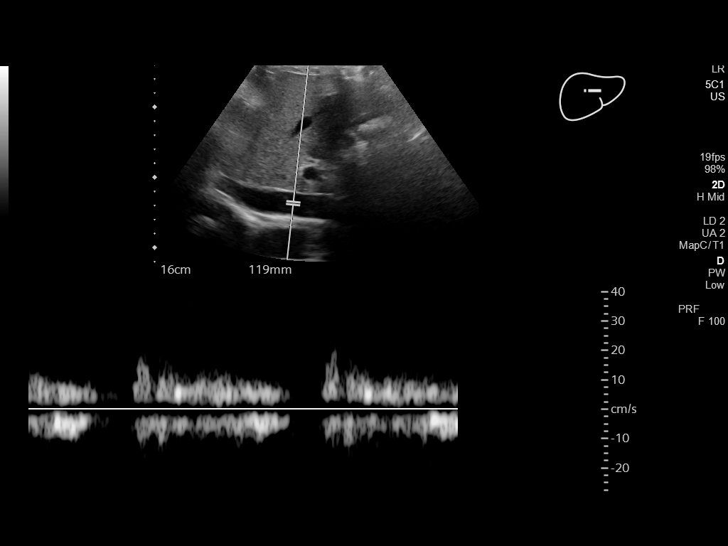
[im 62/62]
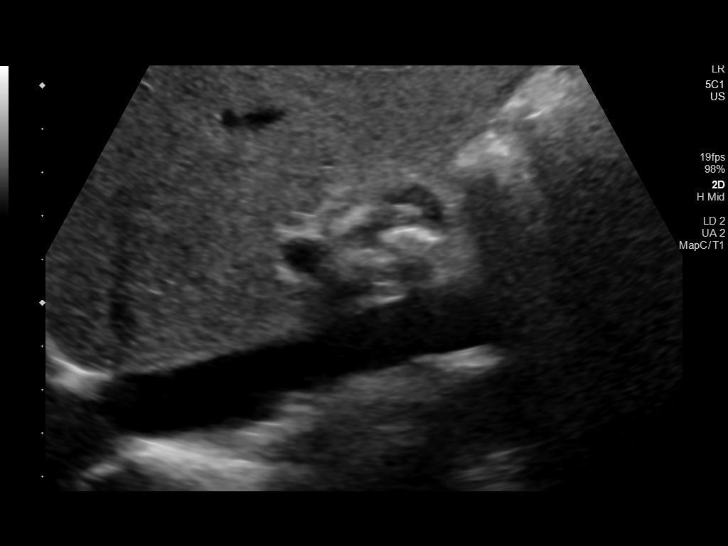

[14 of 25 positions shown; findings below may reference images not displayed]

FINDINGS: Gallbladder:

Numerous echogenic gallstones with shadowing (image 13). Individual
stones estimated up to 14 mm diameter. The gallbladder is partially
contracted. Stones are noted in the gallbladder neck. But no wall
thickening or pericholecystic fluid identified. Mild tenderness with
scanning but no sonographic Murphy sign elicited.

Common bile duct:

Diameter: Up to 5 mm, normal.

Liver:

No focal lesion identified. Within normal limits in parenchymal
echogenicity. Portal vein is patent on color Doppler imaging with
normal direction of blood flow towards the liver.

Other: Negative visible right kidney.
IMPRESSION: Extensive Cholelithiasis, but no evidence of acute cholecystitis or
bile duct obstruction.

## 2022-01-17 NOTE — Patient Instructions (Addendum)
It was a pleasure to see you today!  Chronic low back pain is caused by many things: perhaps lifting too much, being out of shape, injury to the muscles or nerves, or muscle spasm. The best things you can do are continue to move, use different types of therapy (see below), and then strengthen muscles to get better. There is no "quick fix," but by working diligently and using multi-modal approaches you can decrease your pain and be more functional. - pain treatment: you can try voltaren gel, capsaicin, and/or lidocaine patches (not at the same time) for back relief - Also try heating pads, massage, and stretching exercises - keep walking to keep your muscles from getting cold and tight - You can use tylenol for pain as well as naproxen 500 mg BID 3. If no improvement, follow up in 2-4 weeks for referral to physical therapy 4. Your next pap smear is due in August. Schedule a physical at that time   Be Well,  Dr. Chauncey Reading

## 2022-01-17 NOTE — Progress Notes (Signed)
    SUBJECTIVE:   CHIEF COMPLAINT / HPI:   Back pain:  Describes aching pain in lumbar region of back, worse when bending.  Patient has been working in her garden recently, doing lots of bending.  Not worse on left versus right side, present bilaterally. Pain is 7 / 10, not relieved with rest/ice.   No injuries to her back.  Pain does not radiate down the leg. No LE weakness or changes in gait.  No motor weakness/decreased sensation BL LE's.  No fevers or chills.  No dysuria, hematuria, urinary frequency, incontinence of bladder or bowel.    Reminded to make appt for pap smear  PERTINENT  PMH / PSH: Noncontributory  OBJECTIVE:   BP 107/86   Pulse 72   Ht '5\' 7"'$  (1.702 m)   Wt 206 lb 12.8 oz (93.8 kg)   LMP 09/19/2010   SpO2 100%   BMI 32.39 kg/m   Nursing note and vitals reviewed GEN: Age-appropriate, AA W, resting comfortably in chair, NAD, class I obesity HEENT: NCAT. PERRLA. Sclera without injection or icterus. MMM.  Abdomen: Soft, nontender, nondistended, normoactive bowel sounds Lumbar spine: - Inspection: no gross deformity or asymmetry, swelling or ecchymosis. No skin changes - Palpation: No TTP over the spinous processes,  + mild TTP overl b/l paraspinal muscles, +TTP over right SI joints  - ROM: full active ROM of the lumbar spine in flexion and extension with minimal pain - Strength: 5/5 strength of lower extremity in L4-S1 nerve root distributions b/l - Neuro: sensation intact in the L4-S1 nerve root distribution b/l, 2+ L4 and S1 reflexes - Straight Leg Raise test: NEG Neuro: AOx3  Ext: no edema Psych: Pleasant and appropriate  ASSESSMENT/PLAN:   Lumbar strain, initial encounter 66 year old woman presents with bilateral strain of lumbar muscles after working in her garden.  She has no contraindications to NSAIDs, recommend naproxen, light duty, avoid bending or being in bent over positions for long time.  No red flag symptoms.  Follow-up in 2 to 4 weeks or sooner if  worsens.  SI (sacroiliac) joint inflammation (HCC) Patient has pain over right SI joint with pelvic laxity, gluteal weakness.  Recommend naproxen 500 mg twice daily, light duty, recommend home exercise program to strengthen glutes and core.  Follow-up in 2 to 4 weeks or sooner if not improved.  Recommend physical therapy if no improvement with home exercise program.     Gladys Damme, [place]

## 2022-01-18 ENCOUNTER — Encounter: Payer: Self-pay | Admitting: Family Medicine

## 2022-01-18 ENCOUNTER — Ambulatory Visit (INDEPENDENT_AMBULATORY_CARE_PROVIDER_SITE_OTHER): Payer: 59 | Admitting: Family Medicine

## 2022-01-18 VITALS — BP 107/86 | HR 72 | Ht 67.0 in | Wt 206.8 lb

## 2022-01-18 DIAGNOSIS — S39012A Strain of muscle, fascia and tendon of lower back, initial encounter: Secondary | ICD-10-CM | POA: Diagnosis not present

## 2022-01-18 DIAGNOSIS — M533 Sacrococcygeal disorders, not elsewhere classified: Secondary | ICD-10-CM | POA: Diagnosis not present

## 2022-01-18 DIAGNOSIS — M545 Low back pain, unspecified: Secondary | ICD-10-CM | POA: Diagnosis not present

## 2022-01-18 DIAGNOSIS — M461 Sacroiliitis, not elsewhere classified: Secondary | ICD-10-CM | POA: Diagnosis not present

## 2022-01-18 MED ORDER — NAPROXEN 500 MG PO TABS: 500.0000 mg | ORAL_TABLET | Freq: Two times a day (BID) | ORAL | 0 refills | Status: DC

## 2022-01-20 DIAGNOSIS — M461 Sacroiliitis, not elsewhere classified: Secondary | ICD-10-CM | POA: Insufficient documentation

## 2022-01-20 DIAGNOSIS — S39012A Strain of muscle, fascia and tendon of lower back, initial encounter: Secondary | ICD-10-CM | POA: Insufficient documentation

## 2022-01-20 NOTE — Assessment & Plan Note (Signed)
66 year old woman presents with bilateral strain of lumbar muscles after working in her garden.  She has no contraindications to NSAIDs, recommend naproxen, light duty, avoid bending or being in bent over positions for long time.  No red flag symptoms.  Follow-up in 2 to 4 weeks or sooner if worsens.

## 2022-01-20 NOTE — Assessment & Plan Note (Signed)
Patient has pain over right SI joint with pelvic laxity, gluteal weakness.  Recommend naproxen 500 mg twice daily, light duty, recommend home exercise program to strengthen glutes and core.  Follow-up in 2 to 4 weeks or sooner if not improved.  Recommend physical therapy if no improvement with home exercise program.

## 2022-02-03 ENCOUNTER — Other Ambulatory Visit: Payer: Self-pay | Admitting: Family Medicine

## 2022-02-03 DIAGNOSIS — M545 Low back pain, unspecified: Secondary | ICD-10-CM

## 2022-02-03 DIAGNOSIS — M533 Sacrococcygeal disorders, not elsewhere classified: Secondary | ICD-10-CM

## 2022-02-07 ENCOUNTER — Ambulatory Visit: Payer: 59 | Admitting: Podiatry

## 2022-02-07 ENCOUNTER — Telehealth: Payer: Self-pay | Admitting: Podiatry

## 2022-02-07 NOTE — Telephone Encounter (Signed)
Pt left voicemail at 1239pm on 7.20 to cxl her appt at 230 today because she is not able to get off work and she will call to r/s..  I did call and leave a message for pt that we did get the message and appt was cxled.

## 2022-02-26 ENCOUNTER — Encounter (HOSPITAL_COMMUNITY): Payer: Self-pay

## 2022-02-26 ENCOUNTER — Other Ambulatory Visit: Payer: Self-pay

## 2022-02-26 ENCOUNTER — Emergency Department (HOSPITAL_COMMUNITY)
Admission: EM | Admit: 2022-02-26 | Discharge: 2022-02-27 | Disposition: A | Discharge status: Home / Self Care | Payer: 59 | Attending: Emergency Medicine | Admitting: Emergency Medicine

## 2022-02-26 ENCOUNTER — Emergency Department (HOSPITAL_COMMUNITY): Payer: 59

## 2022-02-26 DIAGNOSIS — R202 Paresthesia of skin: Secondary | ICD-10-CM | POA: Insufficient documentation

## 2022-02-26 DIAGNOSIS — Z79899 Other long term (current) drug therapy: Secondary | ICD-10-CM | POA: Diagnosis not present

## 2022-02-26 DIAGNOSIS — I10 Essential (primary) hypertension: Secondary | ICD-10-CM | POA: Insufficient documentation

## 2022-02-26 DIAGNOSIS — M5030 Other cervical disc degeneration, unspecified cervical region: Secondary | ICD-10-CM

## 2022-02-26 HISTORY — DX: Pure hypercholesterolemia, unspecified: E78.00

## 2022-02-26 LAB — CBC WITH DIFFERENTIAL/PLATELET
Abs Immature Granulocytes: 0 10*3/uL (ref 0.00–0.07)
Basophils Absolute: 0.1 10*3/uL (ref 0.0–0.1)
Basophils Relative: 1 %
Eosinophils Absolute: 0.2 10*3/uL (ref 0.0–0.5)
Eosinophils Relative: 5 %
HCT: 40.4 % (ref 36.0–46.0)
Hemoglobin: 13.1 g/dL (ref 12.0–15.0)
Immature Granulocytes: 0 %
Lymphocytes Relative: 60 %
Lymphs Abs: 2.8 10*3/uL (ref 0.7–4.0)
MCH: 28.2 pg (ref 26.0–34.0)
MCHC: 32.4 g/dL (ref 30.0–36.0)
MCV: 87.1 fL (ref 80.0–100.0)
Monocytes Absolute: 0.4 10*3/uL (ref 0.1–1.0)
Monocytes Relative: 9 %
Neutro Abs: 1.2 10*3/uL — ABNORMAL LOW (ref 1.7–7.7)
Neutrophils Relative %: 25 %
Platelets: 240 10*3/uL (ref 150–400)
RBC: 4.64 MIL/uL (ref 3.87–5.11)
RDW: 14.4 % (ref 11.5–15.5)
WBC: 4.7 10*3/uL (ref 4.0–10.5)
nRBC: 0 % (ref 0.0–0.2)

## 2022-02-26 LAB — MAGNESIUM: Magnesium: 2.1 mg/dL (ref 1.7–2.4)

## 2022-02-26 LAB — COMPREHENSIVE METABOLIC PANEL
ALT: 25 U/L (ref 0–44)
AST: 24 U/L (ref 15–41)
Albumin: 4.2 g/dL (ref 3.5–5.0)
Alkaline Phosphatase: 64 U/L (ref 38–126)
Anion gap: 8 (ref 5–15)
BUN: 13 mg/dL (ref 8–23)
CO2: 23 mmol/L (ref 22–32)
Calcium: 9.5 mg/dL (ref 8.9–10.3)
Chloride: 109 mmol/L (ref 98–111)
Creatinine, Ser: 0.7 mg/dL (ref 0.44–1.00)
GFR, Estimated: >60 mL/min (ref >60)
Glucose, Bld: 98 mg/dL (ref 70–99)
Potassium: 3.6 mmol/L (ref 3.5–5.1)
Sodium: 140 mmol/L (ref 135–145)
Total Bilirubin: 0.5 mg/dL (ref 0.3–1.2)
Total Protein: 7.2 g/dL (ref 6.5–8.1)

## 2022-02-26 LAB — TROPONIN I (HIGH SENSITIVITY): Troponin I (High Sensitivity): 3 ng/L (ref <18)

## 2022-02-26 NOTE — ED Triage Notes (Signed)
Patient c/o tingling when she wakes in the AM x "a few days." Patient states less frequent during the day.

## 2022-02-26 NOTE — ED Provider Notes (Signed)
[facility] DEPT Provider Note   CSN: [id number] Arrival date & time: 02/26/22  1533     History {Add pertinent medical, surgical, social history, OB history to HPI:1} Chief Complaint  Patient presents with   Tingling    Alexandra White is a 66 y.o. female.  Patient with no chronic medical condition other than hypertension presenting with tingling to her bilateral hands for the past 2 days involving all of the fingers on both sides.  Tingling does not extend past wrists.  She describes pins and needle sensation involving all the fingers of her bilateral hands worse on the right side.  No weakness in grip strength.  Asked me  The history is provided by the patient.       Home Medications Prior to Admission medications   Medication Sig Start Date End Date Taking? Authorizing Provider  acetaminophen (TYLENOL) 500 MG tablet Take 1,000 mg by mouth every 6 (six) hours as needed.    [provider]  atorvastatin (LIPITOR) 40 MG tablet TAKE 1 TABLET BY MOUTH EVERY DAY 11/12/21   Precious Gilding, DO  loratadine (CLARITIN) 10 MG tablet Take 1 tablet (10 mg total) by mouth daily. 09/12/21   Carollee Leitz, MD  losartan-hydrochlorothiazide [facility]) 50-12.5 MG tablet TAKE 1 TABLET BY MOUTH EVERY DAY 12/24/21   Precious Gilding, DO  naproxen (NAPROSYN) 500 MG tablet Take 1 tablet (500 mg total) by mouth 2 (two) times daily with a meal. 02/04/22   Precious Gilding, DO      Allergies    Amoxicillin and Penicillins    Review of Systems   Review of Systems  Physical Exam Updated Vital Signs BP (!) 153/95 (BP Location: Left Arm)   Pulse 66   Temp 98.4 F (36.9 C) (Oral)   Resp 16   Ht '5\' 7"'$  (1.702 m)   Wt 89.8 kg   LMP 09/19/2010   SpO2 99%   BMI 31.01 kg/m  Physical Exam Vitals and nursing note reviewed.  Constitutional:      General: She is not in acute distress.    Appearance: She is well-developed.  HENT:     Head: Normocephalic and atraumatic.      Mouth/Throat:     Pharynx: No oropharyngeal exudate.  Eyes:     Conjunctiva/sclera: Conjunctivae normal.     Pupils: Pupils are equal, round, and reactive to light.  Neck:     Comments: No meningismus. Cardiovascular:     Rate and Rhythm: Normal rate and regular rhythm.     Heart sounds: Normal heart sounds. No murmur heard. Pulmonary:     Effort: Pulmonary effort is normal. No respiratory distress.     Breath sounds: Normal breath sounds.  Abdominal:     Palpations: Abdomen is soft.     Tenderness: There is no abdominal tenderness. There is no guarding or rebound.  Musculoskeletal:        General: No tenderness. Normal range of motion.     Cervical back: Normal range of motion and neck supple.  Skin:    General: Skin is warm.  Neurological:     Mental Status: She is alert and oriented to person, place, and time.     Cranial Nerves: No cranial nerve deficit.     Motor: No abnormal muscle tone.     Coordination: Coordination normal.     Comments:  5/5 strength throughout. CN 2-12 intact.Equal grip strength.   Radial pulses intact bilaterally, cardinal hand was intact bilaterally.  There is tingling to palpation of the median nerve bilaterally.  Whole hands feel weak and tingly bilaterally  Psychiatric:        Behavior: Behavior normal.     ED Results / Procedures / Treatments   Labs (all labs ordered are listed, but only abnormal results are displayed) Labs Reviewed  CBC WITH DIFFERENTIAL/PLATELET - Abnormal; Notable for the following components:      Result Value   Neutro Abs 1.2 (*)    All other components within normal limits  COMPREHENSIVE METABOLIC PANEL  MAGNESIUM  TROPONIN I (HIGH SENSITIVITY)    EKG None  Radiology No results found.  Procedures Procedures  {Document cardiac monitor, telemetry assessment procedure when appropriate:1}  Medications Ordered in ED Medications - No data to display  ED Course/ Medical Decision Making/ A&P                            Medical Decision Making Amount and/or Complexity of Data Reviewed Labs: ordered. Decision-making details documented in ED Course. Radiology: ordered and independent interpretation performed. Decision-making details documented in ED Course. ECG/medicine tests: ordered and independent interpretation performed. Decision-making details documented in ED Course.   Bilateral paresthesias to her hands for the past 2 days.  No weakness.  {Document critical care time when appropriate:1} {Document review of labs and clinical decision tools ie heart score, Chads2Vasc2 etc:1}  {Document your independent review of radiology images, and any outside records:1} {Document your discussion with family members, caretakers, and with consultants:1} {Document social determinants of health affecting pt's care:1} {Document your decision making why or why not admission, treatments were needed:1} Final Clinical Impression(s) / ED Diagnoses Final diagnoses:  None    Rx / DC Orders ED Discharge Orders     None

## 2022-02-26 NOTE — ED Provider Triage Note (Signed)
Emergency Medicine Provider Triage Evaluation Note  Alexandra White , a 66 y.o. female  was evaluated in triage.  Pt complains of tingling in both hands for 2 days   Review of Systems  Positive: Tingling in hands   Negative: No weakness  Physical Exam  BP (!) 153/95 (BP Location: Left Arm)   Pulse 66   Temp 98.4 F (36.9 C) (Oral)   Resp 16   Ht '5\' 7"'$  (1.702 m)   Wt 89.8 kg   LMP 09/19/2010   SpO2 99%   BMI 31.01 kg/m  Gen:   Awake, no distress   Resp:  Normal effort  MSK:   Moves extremities without difficulty  Other:    Medical Decision Making  Medically screening exam initiated at 4:32 PM.  Appropriate orders placed.  Sonnie Bias was informed that the remainder of the evaluation will be completed by another provider, this initial triage assessment does not replace that evaluation, and the importance of remaining in the ED until their evaluation is complete.     Fransico Meadow, [place] 02/26/22 5320

## 2022-02-27 ENCOUNTER — Other Ambulatory Visit: Payer: Self-pay | Admitting: Family Medicine

## 2022-02-27 DIAGNOSIS — Z1231 Encounter for screening mammogram for malignant neoplasm of breast: Secondary | ICD-10-CM

## 2022-02-27 MED ORDER — NAPROXEN 500 MG PO TABS: 500.0000 mg | ORAL_TABLET | Freq: Two times a day (BID) | ORAL | 0 refills | Status: DC | PRN

## 2022-02-27 NOTE — Discharge Instructions (Addendum)
Your testing shows no evidence of stroke or spinal cord compression.  Follow-up with the neurologist.  Return to the ED with new or worsening symptoms

## 2022-02-28 ENCOUNTER — Encounter: Payer: Self-pay | Admitting: Podiatry

## 2022-02-28 ENCOUNTER — Ambulatory Visit (INDEPENDENT_AMBULATORY_CARE_PROVIDER_SITE_OTHER): Payer: 59

## 2022-02-28 ENCOUNTER — Ambulatory Visit (INDEPENDENT_AMBULATORY_CARE_PROVIDER_SITE_OTHER): Payer: 59 | Admitting: Podiatry

## 2022-02-28 DIAGNOSIS — M722 Plantar fascial fibromatosis: Secondary | ICD-10-CM

## 2022-02-28 MED ORDER — TRIAMCINOLONE ACETONIDE 40 MG/ML IJ SUSP
20.0000 mg | Freq: Once | INTRAMUSCULAR | Status: AC
  Administered 2022-02-28: 20 mg

## 2022-02-28 MED ORDER — MELOXICAM 15 MG PO TABS: 15.0000 mg | ORAL_TABLET | Freq: Every day | ORAL | 3 refills | Status: DC

## 2022-02-28 MED ORDER — METHYLPREDNISOLONE 4 MG PO TBPK: ORAL_TABLET | ORAL | 0 refills | Status: DC

## 2022-02-28 NOTE — Progress Notes (Signed)
Subjective:  Patient ID: Alexandra White, female    DOB: Sep 29, 1955,  MRN: [id number] HPI Chief Complaint  Patient presents with   Foot Pain    Plantar heel right - aching x 1 week, went to ER cause she was having tingling on feet and fingers-said could be a back issue, AM pain, tried Tylenol   New Patient (Initial Visit)    66 y.o. female presents with the above complaint.   ROS: Denies fever chills nausea vomiting muscle aches pains calf pain back pain chest pain shortness of breath.  Past Medical History:  Diagnosis Date   High cholesterol    HTN (hypertension) 09/20/2009   Qualifier: Diagnosis of  By: Buelah Manis MD, Lonell Grandchild     Past Surgical History:  Procedure Laterality Date   CESAREAN SECTION     MULTIPLE TOOTH EXTRACTIONS      Current Outpatient Medications:    meloxicam (MOBIC) 15 MG tablet, Take 1 tablet (15 mg total) by mouth daily., Disp: 30 tablet, Rfl: 3   methylPREDNISolone (MEDROL DOSEPAK) 4 MG TBPK tablet, 6 day dose pack - take as directed, Disp: 21 tablet, Rfl: 0   acetaminophen (TYLENOL) 500 MG tablet, Take 1,000 mg by mouth every 6 (six) hours as needed., Disp: , Rfl:    atorvastatin (LIPITOR) 40 MG tablet, TAKE 1 TABLET BY MOUTH EVERY DAY, Disp: 90 tablet, Rfl: 3   loratadine (CLARITIN) 10 MG tablet, Take 1 tablet (10 mg total) by mouth daily., Disp: 30 tablet, Rfl: 0   losartan-hydrochlorothiazide (HYZAAR) 50-12.5 MG tablet, TAKE 1 TABLET BY MOUTH EVERY DAY, Disp: 90 tablet, Rfl: 1   naproxen (NAPROSYN) 500 MG tablet, Take 1 tablet (500 mg total) by mouth 2 (two) times daily as needed., Disp: 30 tablet, Rfl: 0  Allergies  Allergen Reactions   Amoxicillin    Penicillins     REACTION: Hives/ Nausea and vomiting   Review of Systems Objective:  There were no vitals filed for this visit.  General: Well developed, nourished, in no acute distress, alert and oriented x3   Dermatological: Skin is warm, dry and supple bilateral. Nails x 10 are well maintained;  remaining integument appears unremarkable at this time. There are no open sores, no preulcerative lesions, no rash or signs of infection present.  Vascular: Dorsalis Pedis artery and Posterior Tibial artery pedal pulses are 2/4 bilateral with immedate capillary fill time. Pedal hair growth present. No varicosities and no lower extremity edema present bilateral.   Neruologic: Grossly intact via light touch bilateral. Vibratory intact via tuning fork bilateral. Protective threshold with Semmes Wienstein monofilament intact to all pedal sites bilateral. Patellar and Achilles deep tendon reflexes 2+ bilateral. No Babinski or clonus noted bilateral.   Musculoskeletal: No gross boney pedal deformities bilateral. No pain, crepitus, or limitation noted with foot and ankle range of motion bilateral. Muscular strength 5/5 in all groups tested bilateral.  Pain on palpation medial calcaneal tubercle of the right heel.  Pain on medial lateral compression of the calcaneus however.  Gait: Unassisted, Nonantalgic.    Radiographs:  Radiographs taken of the right lower extremity demonstrate an osseously mature foot plantar distally oriented calcaneal heel spur mildly rectus foot.  Soft tissue increase in density plantar fascial insertion site no significant osseous abnormalities.  Assessment & Plan:   Assessment: Planter fasciitis right  Plan: Discussed etiology pathology conservative surgical therapies at this point I injected her right heel today 20 mg Kenalog 5 mg Marcaine.  Darted on Medrol Dosepak to be  followed by meloxicam.  Discussed appropriate shoe gear stretching exercise ice therapy sugar modifications.  Follow-up with her in 1 month     Little Bashore T. [place], [place]

## 2022-03-19 ENCOUNTER — Ambulatory Visit
Admission: RE | Admit: 2022-03-19 | Discharge: 2022-03-19 | Disposition: A | Discharge status: Home / Self Care | Payer: 59 | Source: Ambulatory Visit | Attending: Family Medicine | Admitting: Family Medicine

## 2022-03-19 DIAGNOSIS — Z1231 Encounter for screening mammogram for malignant neoplasm of breast: Secondary | ICD-10-CM

## 2022-03-21 ENCOUNTER — Other Ambulatory Visit: Payer: Self-pay | Admitting: Family Medicine

## 2022-03-21 DIAGNOSIS — R928 Other abnormal and inconclusive findings on diagnostic imaging of breast: Secondary | ICD-10-CM

## 2022-03-22 ENCOUNTER — Encounter: Payer: Self-pay | Admitting: Student

## 2022-03-22 ENCOUNTER — Other Ambulatory Visit: Payer: Self-pay | Admitting: Student

## 2022-03-22 DIAGNOSIS — M545 Low back pain, unspecified: Secondary | ICD-10-CM

## 2022-03-22 DIAGNOSIS — M533 Sacrococcygeal disorders, not elsewhere classified: Secondary | ICD-10-CM

## 2022-03-26 ENCOUNTER — Ambulatory Visit: Payer: 59 | Admitting: Diagnostic Neuroimaging

## 2022-04-01 NOTE — Patient Instructions (Addendum)
It was wonderful to see you today.  Please bring ALL of your medications with you to every visit.   Today we talked about:  -Look on [place] for a plantar fasciitis night splint  -We are doing blood work today to check your cholesterol level -I've provided you with paperwork for you to think about your advanced directives -Follow up with your Podiatrist as scheduled -You can buy over the counter Debrox ear drops to use for your left ear. You can come back in 2 weeks for an ear irrigation   Today at your annual preventive visit we talked about the following measures: I recommend 150 minutes of exercise per week-try 30 minutes 5 days per week We discussed reducing sugary beverages (like soda and juice) and increasing leafy greens and whole fruits.  We discussed avoiding tobacco and alcohol.  I recommend avoiding illicit substances.  Your blood pressure is near goal of <130/80.   Thank you for choosing [facility].   Please call [phone] with any questions about today's appointment.  Please be sure to schedule follow up at the front  desk before you leave today.   Sharion Settler, DO PGY-3 Family Medicine

## 2022-04-01 NOTE — Progress Notes (Signed)
    SUBJECTIVE:   Chief compliant/HPI: annual examination  Alexandra White is a 66 y.o. who presents today for an annual exam.  Current concerns: Still having some right heel pain- was told she has plantar fasciitis.  Followed by Podiatry for this, she has another upcoming appointment.   History tabs reviewed and updated.   Review of systems form reviewed and negative for chest pain, shortness of breath, bloody stool.   OBJECTIVE:   BP 124/82 (BP Location: Left Arm, Patient Position: Sitting)   Pulse 66   Wt 208 lb 6.4 oz (94.5 kg)   LMP 09/19/2010   BMI 32.64 kg/m   General: Awake, alert, oriented, in no acute distress, pleasant and cooperative with examination HEENT: Normocephalic, atraumatic, nares patent, dentition is fair- has upper retainer, oropharynx without erythema or exudates, left internal auditory canal impacted with cerumen unable to visualize TM, right TM clear, no thyroid nodules palpated Cardio: RRR without murmur, 2+ radial, DP and PT pulses b/l Respiratory: CTAB without wheezing/rhonchi/rales Abdomen: Soft, non-tender to palpation MSK: Able to move all extremities spontaneously, good muscle strength, no abnormalities. Tenderness to palpation of right heel Extremities: without edema or cyanosis Neuro: Speech is clear and intact, no focal deficits, no facial asymmetry, follows commands  Psych: Normal mood and affect   ASSESSMENT/PLAN:   Plantar Fasciitis Followed by podiatry.  She has upcoming appointment later this month.  Recommended that she trial plantar fasciitis night splints to see if this helps to relieve some discomfort in the morning.  She should keep her appointment with podiatry.  Impacted Cerumen Recommend that she use over-the-counter Debrox drops to her left ear.  She can return in a couple weeks for cerumen irrigation.  Annual Examination  See AVS for age appropriate recommendations  PHQ score 0, reviewed and discussed.  BP reviewed and at  goal.  Asked about intimate partner violence and resources given as appropriate  Advance directives discussion: Yes, provided with packet today.   Considered the following items based upon USPSTF recommendations: Diabetes screening:  prediabetic- last check in January Screening for elevated cholesterol: discussed and ordered HIV testing:  Previously ordered and negative Hepatitis C:  Previously ordered and negative Hepatitis B:  Not at high risk and not ordered Syphilis if at high risk:  Not at high risk and not ordered GC/CT not at high risk and not ordered. Osteoporosis screening considered based upon risk of fracture from [facility] calculator. Major osteoporotic fracture risk is 7.3%. DEXA ordered.  Reviewed risk factors for latent tuberculosis and not indicated  Discussed family history, BRCA testing not indicated. Tool used to risk stratify was Pedigree Assessment Tool.  Cervical cancer screening:  Last Pap with HPV cotesting in 02/2019 was normal Breast cancer screening:  Her mammogram in August was incomplete, she scheduled for follow-up diagnostic mammogram and ultrasounds of both breasts on 9/27 Colorectal cancer screening:  Colonoscopy in June 2021 revealed a 3 mm polyp in the transverse colon and a less than 1 meet millimeter polyp in the proximal ascending colon. Recommended to return in 7 years. Lung cancer screening:  not indicated .  Vaccinations: will offer Shingrix.   Follow up in 1  year or sooner if indicated.    Sabino Dick, DO [facility]

## 2022-04-02 ENCOUNTER — Ambulatory Visit (INDEPENDENT_AMBULATORY_CARE_PROVIDER_SITE_OTHER): Payer: 59 | Admitting: Family Medicine

## 2022-04-02 ENCOUNTER — Other Ambulatory Visit: Payer: Self-pay | Admitting: Student

## 2022-04-02 VITALS — BP 124/82 | HR 66 | Wt 208.4 lb

## 2022-04-02 DIAGNOSIS — E7841 Elevated Lipoprotein(a): Secondary | ICD-10-CM

## 2022-04-02 DIAGNOSIS — Z13820 Encounter for screening for osteoporosis: Secondary | ICD-10-CM

## 2022-04-02 DIAGNOSIS — H6122 Impacted cerumen, left ear: Secondary | ICD-10-CM

## 2022-04-02 DIAGNOSIS — M722 Plantar fascial fibromatosis: Secondary | ICD-10-CM | POA: Diagnosis not present

## 2022-04-02 DIAGNOSIS — Z0000 Encounter for general adult medical examination without abnormal findings: Secondary | ICD-10-CM | POA: Diagnosis not present

## 2022-04-02 DIAGNOSIS — I10 Essential (primary) hypertension: Secondary | ICD-10-CM

## 2022-04-02 MED ORDER — ACETAMINOPHEN 500 MG PO TABS: 1000.0000 mg | ORAL_TABLET | Freq: Four times a day (QID) | ORAL | 1 refills | Status: AC | PRN

## 2022-04-02 MED ORDER — ZOSTER VAC RECOMB ADJUVANTED 50 MCG/0.5ML IM SUSR: 0.5000 mL | Freq: Once | INTRAMUSCULAR | 0 refills | Status: AC

## 2022-04-03 LAB — LIPID PANEL
Chol/HDL Ratio: 2.4 ratio (ref 0.0–4.4)
Cholesterol, Total: 160 mg/dL (ref 100–199)
HDL: 67 mg/dL (ref >39)
LDL Chol Calc (NIH): 78 mg/dL (ref 0–99)
Triglycerides: 79 mg/dL (ref 0–149)
VLDL Cholesterol Cal: 15 mg/dL (ref 5–40)

## 2022-04-04 ENCOUNTER — Telehealth: Payer: Self-pay

## 2022-04-04 NOTE — Telephone Encounter (Signed)
Patient LVM on nurse line regarding lab results. Called patient back to discuss.   Patient did not answer. LVM asking patient to return call to office.   Talbot Grumbling, RN

## 2022-04-05 NOTE — Telephone Encounter (Signed)
Patient returns call to nurse line. Informed of results per Dr. Nita Sells.   Talbot Grumbling, RN

## 2022-04-09 ENCOUNTER — Ambulatory Visit (INDEPENDENT_AMBULATORY_CARE_PROVIDER_SITE_OTHER): Payer: 59 | Admitting: Podiatry

## 2022-04-09 ENCOUNTER — Encounter: Payer: Self-pay | Admitting: Podiatry

## 2022-04-09 DIAGNOSIS — M722 Plantar fascial fibromatosis: Secondary | ICD-10-CM | POA: Diagnosis not present

## 2022-04-09 MED ORDER — TRIAMCINOLONE ACETONIDE 40 MG/ML IJ SUSP: 20.0000 mg | Freq: Once | INTRAMUSCULAR | Status: DC

## 2022-04-09 NOTE — Progress Notes (Signed)
She presents today for follow-up of her Planter fasciitis of her right foot she states that is okay but is not well yet she relates approximately 15% improvement to the right heel.  States that she continues to take her meloxicam but she continues to wear her open heeled and high heel shoes.  Objective: Vital signs are stable she is alert oriented x3 she has pain on palpation medial calcaneal tubercle of her right heel.  Assessment: Intractable Planter fasciitis right.  Plan: Encouraged her to wear appropriate tennis shoes which we discussed.  I reinjected her right heel today 20 mg Kenalog 5 mg Marcaine point maximal tenderness continue the use of her meloxicam.  And we dispensed a night splint.  I will follow-up with her in 1 month to 6 weeks.

## 2022-04-16 ENCOUNTER — Encounter: Payer: Self-pay | Admitting: Diagnostic Neuroimaging

## 2022-04-16 ENCOUNTER — Ambulatory Visit: Payer: 59 | Admitting: Diagnostic Neuroimaging

## 2022-04-17 ENCOUNTER — Ambulatory Visit: Payer: 59

## 2022-04-17 ENCOUNTER — Ambulatory Visit
Admission: RE | Admit: 2022-04-17 | Discharge: 2022-04-17 | Disposition: A | Discharge status: Home / Self Care | Payer: 59 | Source: Ambulatory Visit | Attending: Family Medicine | Admitting: Family Medicine

## 2022-04-17 ENCOUNTER — Ambulatory Visit: Admission: RE | Admit: 2022-04-17 | Payer: 59 | Source: Ambulatory Visit

## 2022-04-17 DIAGNOSIS — R928 Other abnormal and inconclusive findings on diagnostic imaging of breast: Secondary | ICD-10-CM

## 2022-05-09 ENCOUNTER — Other Ambulatory Visit: Payer: Self-pay | Admitting: Podiatry

## 2022-05-09 NOTE — Telephone Encounter (Signed)
Please advise for a 90 day supply

## 2022-05-14 ENCOUNTER — Ambulatory Visit: Payer: 59 | Admitting: Podiatrist

## 2022-05-14 DIAGNOSIS — M722 Plantar fascial fibromatosis: Secondary | ICD-10-CM | POA: Diagnosis not present

## 2022-05-14 MED ORDER — DICLOFENAC SODIUM 75 MG PO TBEC: 75.0000 mg | DELAYED_RELEASE_TABLET | Freq: Two times a day (BID) | ORAL | 2 refills | Status: DC

## 2022-05-20 NOTE — Progress Notes (Signed)
Subjective: Alexandra White is a 66 y.o. female patient presents for follow up of heel pain right foot.  She relates the last injection by Dr. Milinda Pointer was beneficial and she is about 75% better. She relates the pain is now on the outside of the foot and some on the inside is still present. She has tried melixicam and relates she doesn't notice much improvement on this medication.     Patient Active Problem List   Diagnosis Date Noted   Lumbar strain, initial encounter 01/20/2022   SI (sacroiliac) joint inflammation ([place]) 01/20/2022   Pre-syncope 01/06/2020   Sinus bradycardia 01/06/2020   Blood pressure elevated without history of HTN 01/06/2020   Constipation 12/01/2019   Epigastric abdominal pain 11/18/2019   Insomnia 07/16/2019   Fatigue 07/16/2019   Pre-diabetes 07/16/2019   Cancer screening 03/11/2019   Healthcare maintenance 08/23/2015   Obesity 09/08/2012   ASTHMA, INTERMITTENT 04/23/2010   HTN (hypertension) 09/20/2009   HLD (hyperlipidemia) 02/24/2008   Allergic rhinitis 02/24/2008    Current Outpatient Medications on File Prior to Visit  Medication Sig Dispense Refill   acetaminophen (TYLENOL) 500 MG tablet Take 2 tablets (1,000 mg total) by mouth every 6 (six) hours as needed. 30 tablet 1   atorvastatin (LIPITOR) 40 MG tablet TAKE 1 TABLET BY MOUTH EVERY DAY 90 tablet 3   loratadine (CLARITIN) 10 MG tablet Take 1 tablet (10 mg total) by mouth daily. 30 tablet 0   losartan-hydrochlorothiazide (HYZAAR) 50-12.5 MG tablet TAKE 1 TABLET BY MOUTH EVERY DAY 90 tablet 1   meloxicam (MOBIC) 15 MG tablet Take 1 tablet (15 mg total) by mouth daily. 30 tablet 3   naproxen (NAPROSYN) 500 MG tablet TAKE 1 TABLET BY MOUTH 2 TIMES DAILY WITH A MEAL. 30 tablet 0   Current Facility-Administered Medications on File Prior to Visit  Medication Dose Route Frequency Provider Last Rate Last Admin   triamcinolone acetonide (KENALOG-40) injection 20 mg  20 mg Other Once Hyatt, Max T, DPM         Allergies  Allergen Reactions   Amoxicillin    Penicillins     REACTION: Hives/ Nausea and vomiting    Objective: Physical Exam General: The patient is alert and oriented x3 in no acute distress.  Dermatology: Skin is warm, dry and supple bilateral lower extremities. Nails 1-10 are normal. There is no erythema, edema, no eccymosis, no open lesions present. Integument is otherwise unremarkable.  Vascular: Dorsalis Pedis pulse and Posterior Tibial pulse are 2/4 bilateral. Capillary fill time is immediate to all digits.  Neurological: Grossly intact to light touch with an achilles reflex of +2/5 and a  negative Tinel's sign bilateral.  Musculoskeletal: Tenderness to palpation at the medial calcaneal tubercale and through the insertion of the plantar fascia on the Right foot. Some pain on palpation on the lateral side of the heel is also palpated. Marland Kitchen No pain with tuning fork to calcaneus bilateral. No pain with calf compression bilateral. There is decreased Ankle joint range of motion bilateral. All other joints range of motion within normal limits bilateral. Strength 5/5 in all groups bilateral.   Assessment and Plan:   ICD-10-CM   1. Plantar fasciitis  M72.2         - Discussed etiology and pathology of plantar fasciitis along with conservative approach to treatment.   -discussed another injection as she is getting improvement-  she agreed and After oral consent and aseptic prep, injected a mixture containing '20mg'$  Kenalog and '5mg'$  marcaine  plain was infiltrated into the area of maximal tenderness of the Right heel. Post-injection care discussed with patient.   -Rx Diclofenact o start for 2 weeks and taper was advised after 2 weeks.   -Recommended good supportive shoes and advised use of OTC insert.   - Explained custom orthotics are often times prescribed for plantar facsiitis and are beneficial for long term prevention. They may be considered depending upon the success of the  injection and stretching therapy.  -Explained and dispensed to patient daily stretching exercises.. -Patient to see Dr. Milinda Pointer again in 4-6 weeks if symptoms worsen or fail to improve otherwise to call if pain returns.Marland Kitchen

## 2022-05-21 MED ORDER — MELOXICAM 15 MG PO TABS: 15.0000 mg | ORAL_TABLET | Freq: Every day | ORAL | 3 refills | Status: DC

## 2022-05-21 NOTE — Addendum Note (Signed)
Addended by: Clovis Riley E on: 05/21/2022 02:22 PM   Modules accepted: Orders

## 2022-05-28 ENCOUNTER — Ambulatory Visit: Payer: 59 | Admitting: Podiatry

## 2022-05-30 IMAGING — CR DG RIBS W/ CHEST 3+V*L*
5 series · 5 of 5 positions shown · non-contrast
Comparison: 03/10/2001 [DATE].

CLINICAL DATA: Pain left ribs.  History of kidney stones.

EXAM:
LEFT RIBS AND CHEST - 3+ VIEW

[w chest pa]
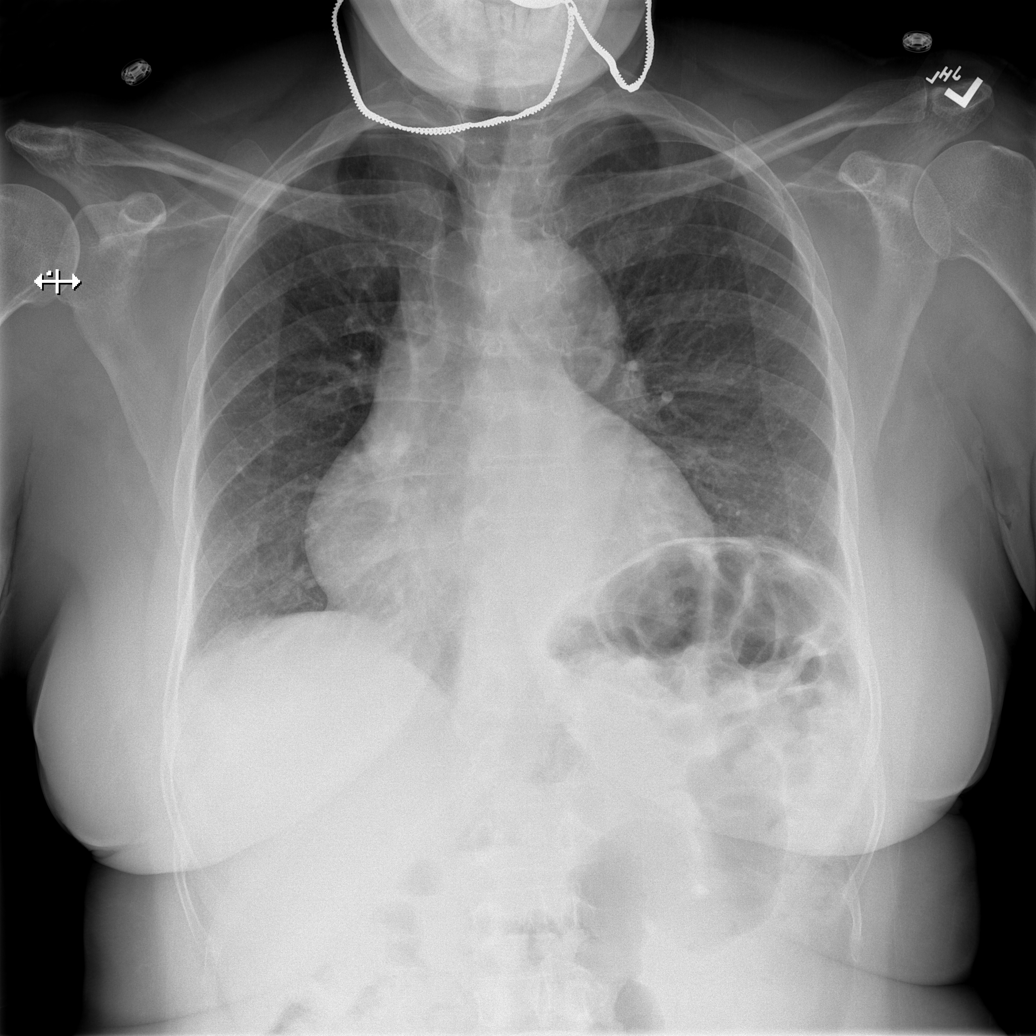

[w ribs ap upper left]
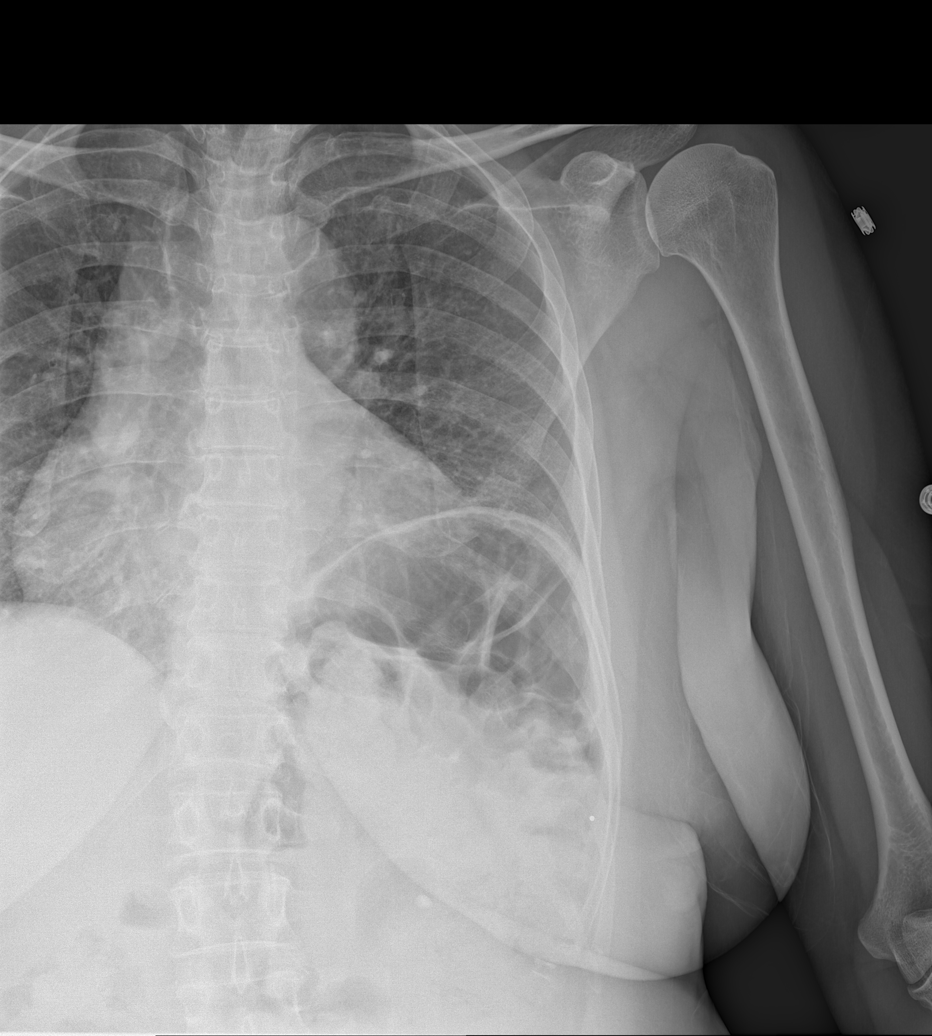

[w ribs ap lower left]
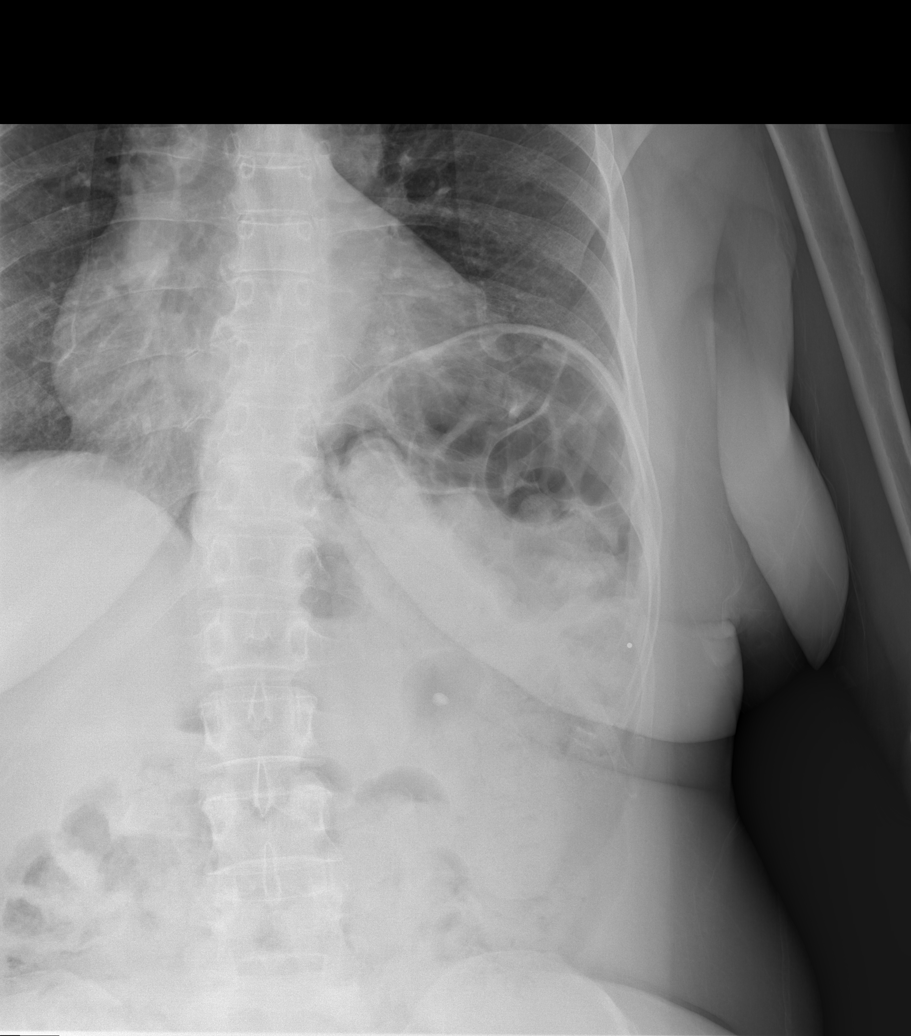

[w ribs obl left (1 of 2)]
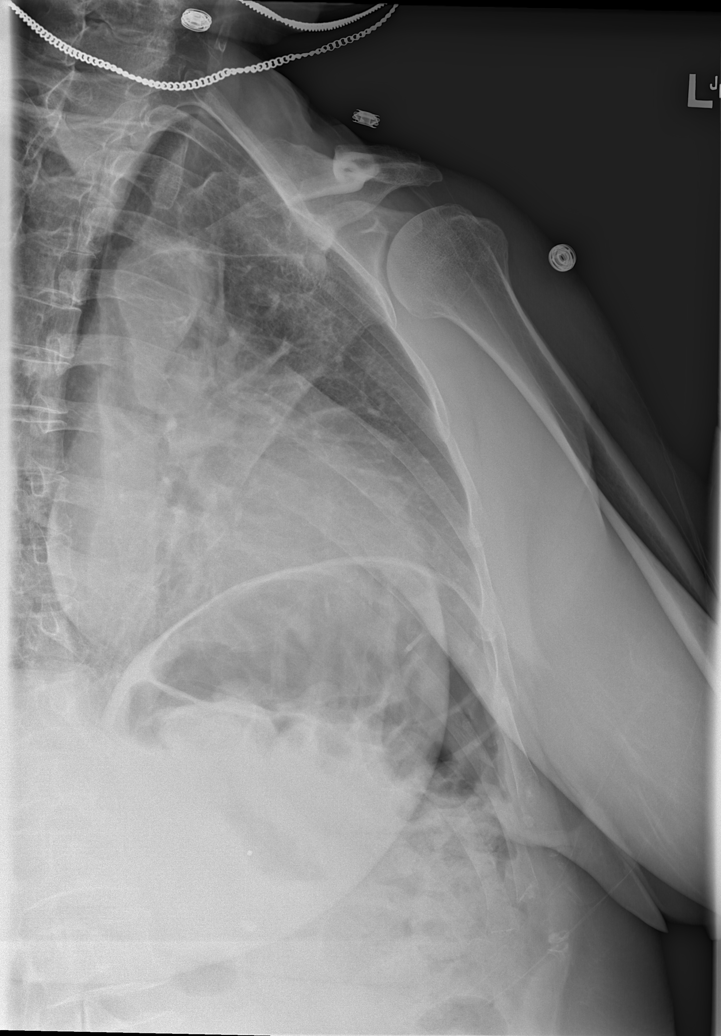

[w ribs obl left (2 of 2)]
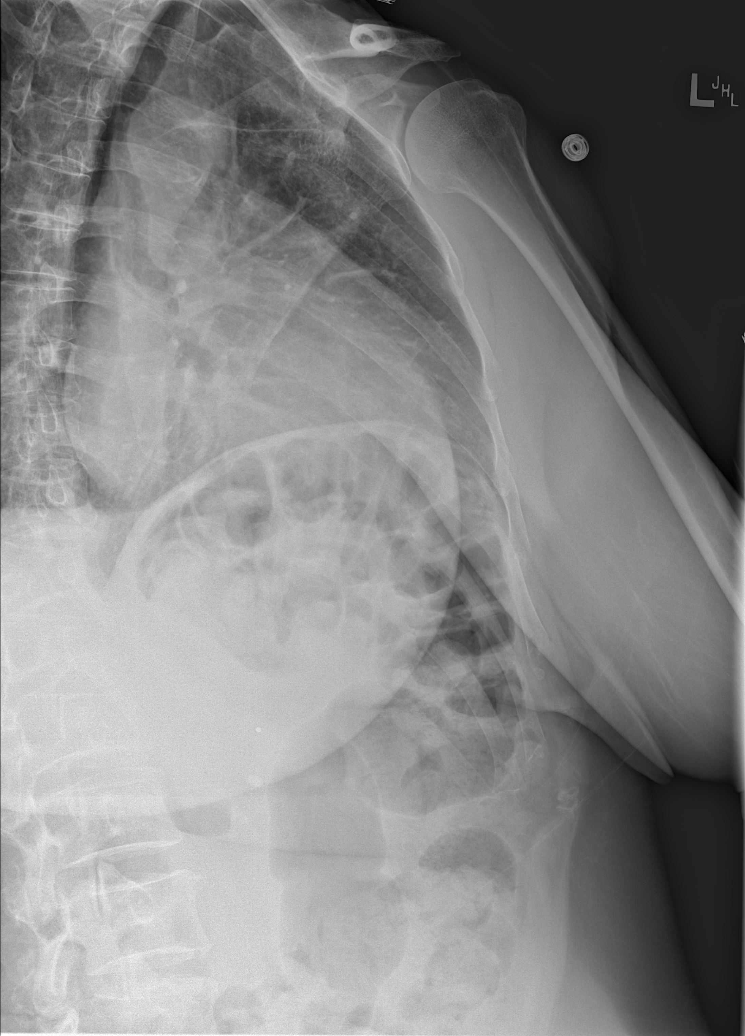

[5 of 5 positions shown; findings below may reference images not displayed]

FINDINGS: Mediastinum hilar structures normal. Low lung volumes with mild
bibasilar atelectasis. Stable elevation left hemidiaphragm.
Interposition of the colon under the left hemidiaphragm again noted.
No evidence of displaced rib fracture or pneumothorax. 6 mm calcific
density noted over the left kidney consistent with left
nephrolithiasis. Degenerative changes scoliosis thoracic spine.
IMPRESSION: 1. No evidence of displaced rib fracture or pneumothorax.
2. Low lung volumes with mild bibasilar atelectasis. Stable
elevation left hemidiaphragm.
3. 6 mm calcific density noted over the left kidney consistent with
left nephrolithiasis.

## 2022-06-03 ENCOUNTER — Telehealth: Payer: Self-pay | Admitting: Diagnostic Neuroimaging

## 2022-06-03 ENCOUNTER — Ambulatory Visit: Payer: 59 | Admitting: Diagnostic Neuroimaging

## 2022-06-03 NOTE — Telephone Encounter (Signed)
Pt cancelled appt due to having a cough, congesion, chills. Transferred to Billing.

## 2022-06-04 ENCOUNTER — Telehealth: Payer: Self-pay

## 2022-06-04 NOTE — Telephone Encounter (Signed)
Patient calls nurse line requesting medication for sinus congestion. She reports that she was previously taking claritin, however, she was told to stop due to her having issues with her BP.   Denies fever, cough, body aches or sick exposures.   She states that this happens every year around this time.   Will forward to PCP for further advisement.   Talbot Grumbling, RN

## 2022-06-05 ENCOUNTER — Other Ambulatory Visit: Payer: Self-pay | Admitting: Student

## 2022-06-05 MED ORDER — LORATADINE 10 MG PO TABS: 10.0000 mg | ORAL_TABLET | Freq: Every day | ORAL | 0 refills | Status: DC

## 2022-06-06 NOTE — Telephone Encounter (Signed)
Patient returns call to nurse line. Informed of below.   Talbot Grumbling, RN

## 2022-06-06 NOTE — Telephone Encounter (Signed)
Called patient. She did not answer, LVM asking patient to return call.   Talbot Grumbling, RN

## 2022-06-28 ENCOUNTER — Other Ambulatory Visit: Payer: Self-pay | Admitting: Student

## 2022-08-17 ENCOUNTER — Other Ambulatory Visit: Payer: Self-pay | Admitting: Student

## 2022-09-03 ENCOUNTER — Ambulatory Visit (INDEPENDENT_AMBULATORY_CARE_PROVIDER_SITE_OTHER): Payer: 59 | Admitting: Student

## 2022-09-03 VITALS — BP 135/78 | HR 75 | Ht 67.0 in | Wt 216.5 lb

## 2022-09-03 DIAGNOSIS — M25511 Pain in right shoulder: Secondary | ICD-10-CM

## 2022-09-03 MED ORDER — NAPROXEN 500 MG PO TABS: 500.0000 mg | ORAL_TABLET | Freq: Two times a day (BID) | ORAL | 0 refills | Status: DC

## 2022-09-03 NOTE — Patient Instructions (Addendum)
It was great to see you! Thank you for allowing me to participate in your care!  I recommend that you always bring your medications to each appointment as this makes it easy to ensure you are on the correct medications and helps [place] not miss when refills are needed.  Our plans for today:  - Take Naproxen with food twice daily for 15 days. - Do not take Meloxicam while taking the naproxen - You can also take over the counter Tylenol as needed and ice and heat if it helps - Return if pain continues over the next couple weeks -Return for pap smear ASAP   Take care and seek immediate care sooner if you develop any concerns.   Dr. Precious Gilding, DO [facility]

## 2022-09-03 NOTE — Progress Notes (Unsigned)
    SUBJECTIVE:   CHIEF COMPLAINT / HPI:   R shoulder pain Started 5 days ago, worse with lifting arm over head.  Feels stiff, achy sometimes a sharp pain. Is constant but sometimes worse than other times. Worse in the morning after sleeping in position where arm is up. Denies numbness or tingling in arm. No neck pain. Has tried cold compress and heat which help a little. Has taken Advil here and there but not helping. Can't think of any recent injury or repetitive motion.  She does state the pain is bad enough that she would like medication.   OBJECTIVE:   BP 135/78   Pulse 75   Ht '5\' 7"'$  (1.702 m)   Wt 216 lb 8 oz (98.2 kg)   LMP 09/19/2010   SpO2 98%   BMI 33.91 kg/m    General: NAD, pleasant, able to participate in exam Cardiac: Well-perfused Respiratory: Breathing comfortably on room air Abdomen: Bowel sounds present, nontender, nondistended, no hepatosplenomegaly. MSK:  L Shoulder: Inspection reveals no obvious deformity, atrophy, or asymmetry. No bruising. No swelling Palpation is normal with no TTP over [facility] joint or bicipital groove. Good ROM in flexion, abduction, internal/external rotation NV intact distally 5/5 muscle strength with flexion, extension, internal and external rotation, abduction and adduction with pain increased on abduction Neuro: alert, no obvious focal deficits Psych: Normal affect and mood  ASSESSMENT/PLAN:   Acute pain of right shoulder Unsure what is causing her shoulder pain at this time.  It is very acute.  She could have slept on it wrong, could be OA, could be rotator cuff injury.  Advised patient that since it is only been going on for 5 days we will wait and watch and treat with anti-inflammatories. -Naproxen 500 mg twice daily for 15 days, advised not to take with her meloxicam -Can also take Tylenol -Can continue ice and heat if it helps -Patient to return if pain worsens/does not improve over the next several weeks   Patient advised to  return in the next month or 2 for Pap smear  Dr. Precious Gilding, [place]

## 2022-09-04 DIAGNOSIS — M25511 Pain in right shoulder: Secondary | ICD-10-CM | POA: Insufficient documentation

## 2022-09-04 NOTE — Assessment & Plan Note (Signed)
Unsure what is causing her shoulder pain at this time.  It is very acute.  She could have slept on it wrong, could be OA, could be rotator cuff injury.  Advised patient that since it is only been going on for 5 days we will wait and watch and treat with anti-inflammatories. -Naproxen 500 mg twice daily for 15 days, advised not to take with her meloxicam -Can also take Tylenol -Can continue ice and heat if it helps -Patient to return if pain worsens/does not improve over the next several weeks

## 2022-09-18 ENCOUNTER — Inpatient Hospital Stay: Admission: RE | Admit: 2022-09-18 | Payer: 59 | Source: Ambulatory Visit

## 2022-09-23 ENCOUNTER — Other Ambulatory Visit: Payer: Self-pay | Admitting: Student

## 2022-09-23 DIAGNOSIS — M25511 Pain in right shoulder: Secondary | ICD-10-CM

## 2022-09-30 ENCOUNTER — Other Ambulatory Visit: Payer: 59

## 2022-10-14 ENCOUNTER — Telehealth: Payer: Self-pay | Admitting: *Deleted

## 2022-10-14 ENCOUNTER — Encounter: Payer: Self-pay | Admitting: Student

## 2022-10-14 NOTE — Telephone Encounter (Signed)
Pt reports that she is still having pain ( naproxen has ran out) and wants to know next steps ( appt with PCP vs referral to [facility])  Christen Bame, [name]

## 2022-10-15 ENCOUNTER — Other Ambulatory Visit: Payer: Self-pay | Admitting: Student

## 2022-10-15 DIAGNOSIS — M25511 Pain in right shoulder: Secondary | ICD-10-CM

## 2022-10-15 NOTE — Progress Notes (Signed)
Pt requests referral to sports medicine for evaluation for R shoulder pain for over a month, see previous note for details. Referral placed.

## 2022-10-15 NOTE — Telephone Encounter (Signed)
Patient returns call to nurse line. Patient is requesting to proceed with sports medicine referral.   Forwarding to PCP.   Talbot Grumbling, RN

## 2022-10-29 ENCOUNTER — Ambulatory Visit: Payer: 59 | Admitting: Sports Medicine

## 2022-10-31 ENCOUNTER — Ambulatory Visit: Payer: 59 | Admitting: Sports Medicine

## 2022-11-11 ENCOUNTER — Ambulatory Visit: Payer: 59 | Admitting: Family Medicine

## 2022-11-23 ENCOUNTER — Other Ambulatory Visit: Payer: Self-pay | Admitting: Student

## 2022-11-23 DIAGNOSIS — M25511 Pain in right shoulder: Secondary | ICD-10-CM

## 2022-12-25 ENCOUNTER — Ambulatory Visit (INDEPENDENT_AMBULATORY_CARE_PROVIDER_SITE_OTHER): Payer: 59 | Admitting: Sports Medicine

## 2022-12-25 VITALS — BP 134/86 | Ht 67.0 in | Wt 192.0 lb

## 2022-12-25 DIAGNOSIS — G8929 Other chronic pain: Secondary | ICD-10-CM | POA: Diagnosis not present

## 2022-12-25 DIAGNOSIS — M25511 Pain in right shoulder: Secondary | ICD-10-CM | POA: Diagnosis not present

## 2022-12-25 MED ORDER — METHYLPREDNISOLONE ACETATE 40 MG/ML IJ SUSP
40.0000 mg | Freq: Once | INTRAMUSCULAR | Status: AC
  Administered 2022-12-25: 40 mg via INTRA_ARTICULAR

## 2022-12-25 NOTE — Progress Notes (Signed)
Alexandra White - 67 y.o. female MRN [id number]  Date of birth: Nov 08, 1955    CHIEF COMPLAINT:   right shoulder pain    SUBJECTIVE:   HPI: Alexandra White is a 67 yo F with no significant PMH who presents to clinic with a one month history of right shoulder pain. The patient believes her pain likely began after mowing her lawn with a push-mower, and describes the pain as an achy sensation felt non locally in the shoulder. Activities such as reaching overhead seems to exacerbate her symptoms while taking naproxen and tylenol help to alleviate her pain. She notes that the pain wakes her up at nighttime. The patient says she felt similar pain in the shoulder about 3-4 months ago and says the pain spontaneously resolved at that time. She denies numbness and tingling in the arm.    ROS:     See HPI  PERTINENT  PMH / PSH FH / / SH:  Past Medical, Surgical, Social, and Family History Reviewed & Updated in the EMR.  Pertinent findings include:  No significant orthopedic pmh  OBJECTIVE: BP 134/86   Ht 5\' 7"  (1.702 m)   Wt 192 lb (87.1 kg)   LMP 09/19/2010   BMI 30.07 kg/m   Physical Exam:  Vital signs are reviewed.  GEN: Alert and oriented, NAD Pulm: Breathing unlabored PSY: normal mood, congruent affect MSK: Right Shoulder - Full ROM with forward flexion and abduction of the arms, pain noted at end ROM, positive painful arc. Sensation intact in RUE. No edema appreciated in RUE. Tender to palpation at bicipital groove. 5/5 strength with external and internal rotation of the arm. Negative lift off test. Positive empty can test. Pain with O'Brien's test. Positive Speeds test   ASSESSMENT & PLAN:  1. Right Shoulder Pain The most likely etiology of this patient's clinical presentation is rotator cuff tendinitis. History of pain with overhead movements in concert with physical exam findings of positive empty can test is consistent with this diagnosis. It is likely that this patient also has an  aspect of biceps tendinitis as this patient was tender to palpation over the bicipital groove and had a positive Speeds test. Given that this patient has had persistent pain over the course of the past month that is keeping her awake at night, she will likely benefit from a cortisone injection as well as additional imaging for workup of other causes of her symptoms such as osteoarthritis.  - X-Ray right shoulder ordered - physical therapy exercises were given to the patient - Cortisone injection was given into the subacromial space - Follow up in 6 weeks. If not improved at next visit can consider ultrasound of biceps with potential cortisone injection geared towards treating the biceps tendinitis.   Procedure performed:  Subacromial Corticosteroid Injection; palpation guided  Consent obtained and verified. Time-out conducted. Noted no overlying erythema, induration, or other signs of local infection. The right posterior subacromial space was palpated and marked. The overlying skin was prepped in a sterile fashion. Topical analgesic spray: Ethyl chloride. Needle: 22 gauge, 1.5 inch Meds: 4 cc 1% lidocaine without epinephrine, 40 mg depo-medrol Completed without difficulty.  Advised to call if fevers/chills, erythema, induration, drainage, or persistent bleeding.   Eustaquio Boyden, MS4   FELLOW ATTESTATION: I personally evaluated the patient with the medical student and agree with the above documentation with following emphasis: Cuff tendinitis.  Subacromial injection followed by home exercises for the next 4 to 6 weeks.  X-rays to evaluate for  arthritis.  Next step would be investigating the biceps with ultrasound +/- ultrasound-guided corticosteroid injection.  Baldemar Friday Rafoth 12/25/2022

## 2023-02-18 ENCOUNTER — Other Ambulatory Visit: Payer: Self-pay | Admitting: Student

## 2023-02-18 DIAGNOSIS — I10 Essential (primary) hypertension: Secondary | ICD-10-CM

## 2023-03-05 ENCOUNTER — Encounter: Payer: Self-pay | Admitting: Family Medicine

## 2023-03-05 ENCOUNTER — Ambulatory Visit (INDEPENDENT_AMBULATORY_CARE_PROVIDER_SITE_OTHER): Payer: 59 | Admitting: Family Medicine

## 2023-03-05 ENCOUNTER — Other Ambulatory Visit: Payer: Self-pay

## 2023-03-05 VITALS — BP 136/97 | HR 74 | Ht 67.0 in | Wt 204.8 lb

## 2023-03-05 DIAGNOSIS — E7841 Elevated Lipoprotein(a): Secondary | ICD-10-CM | POA: Diagnosis not present

## 2023-03-05 DIAGNOSIS — M545 Low back pain, unspecified: Secondary | ICD-10-CM | POA: Diagnosis not present

## 2023-03-05 DIAGNOSIS — M79604 Pain in right leg: Secondary | ICD-10-CM | POA: Diagnosis not present

## 2023-03-05 DIAGNOSIS — I10 Essential (primary) hypertension: Secondary | ICD-10-CM | POA: Diagnosis not present

## 2023-03-05 NOTE — Assessment & Plan Note (Addendum)
Patient's back pain most consistent with sciatica given radiation down R leg and symptom relief with standing/walking. Differential includes fracture, OA, and abnormal growths affecting the spinal region. Not likely cauda equina syndrome given no urinary incontinence or paraesthesias.  - Lumbar xray ordered to rule out acute/chronic bony changes or abnormal growths - Placed referral for physical therapy

## 2023-03-05 NOTE — Progress Notes (Unsigned)
    SUBJECTIVE:   CHIEF COMPLAINT / HPI:   Back pain Patient has been experiencing intermittent back pain over the last 6-8 months. Most recently, this pain has been on and off for the past 2 weeks. The pain is worst in the R lower back region with shooting pains down her R leg. She was previously given muscle relaxants for this pain, but this did not help much and made her very sleepy so she stopped using them. Tylenol provides some relief, and standing/walking provide some relief as well. No recent back injury, no recent falls. No loss of bladder control, no LE paraesthesias.  HTN Patient continues to take daily Losartan-hydrochlorothiazide and feels this has been going well. She does not regularly check her blood pressure at home. She sometimes has a mild frontal headache in the mornings, but these resolve on their own. No recent vision changes.  HLD Patient continues to take daily atorvastatin. No recent muscle aches or pains other than the lower back pain as mentioned above.   PERTINENT  PMH / PSH: Low back pain, HTN, HLD  OBJECTIVE:   BP (!) 136/97   Pulse 74   Ht 5\' 7"  (1.702 m)   Wt 204 lb 12.8 oz (92.9 kg)   LMP 09/19/2010   SpO2 100%   BMI 32.08 kg/m    General: Well-appearing. Resting comfortably in room. CV: Normal S1/S2. No extra heart sounds. Warm and well-perfused. Pulm: Breathing comfortably on room air. CTAB. No increased WOB. MSK: Tender to palpation of R lower lumbar region. + R straight leg test. No CVA tenderness. Skin:  Warm, dry. Psych: Pleasant and appropriate.    ASSESSMENT/PLAN:   Lumbar pain with radiation down right leg Patient's back pain most consistent with sciatica given radiation down R leg and symptom relief with standing/walking. Differential includes fracture, OA, and abnormal growths affecting the spinal region. Not likely cauda equina syndrome given no urinary incontinence or paraesthesias.  - Lumbar xray ordered to rule out acute/chronic  bony changes or abnormal growths - Placed referral for physical therapy   HTN (hypertension) Patient's blood pressure remains overall stable and reasonably close to or within goal range.  - Repeat BMP today  - Continue losartan-hydrochlorothiazide as previously prescribed  HLD (hyperlipidemia) Patient's most recent lipid panel in Sept 2023 was unremarkable. - Repeat lipid panel today - Continue atorvastatin as previously prescribed   Patient to return to clinic in 1 month for annual well check. Will follow up on the issues above at this time as well.   Ivery Quale, MD [facility]

## 2023-03-05 NOTE — Patient Instructions (Addendum)
Thank you for visiting clinic today - it was wonderful to meet you!  Today we discussed your back pain. We have placed an order for you to get an Xray of your lower back to better understand the cause of your pain. You may walk in any time during business hours 8am-5pm at [facility] on [address] to get this back imaging done. We have also placed a referral for you with physical therapy - they should be contacting you for appointment scheduling. Otherwise, we also collected some labs today to check in on your kidney health and cholesterol levels. We will follow up on those as the results come back.   Please schedule an appointment in the next month for you annual well check. We will check in further on your back pain at this time as well. Please reach out any time with any questions or concerns you may have - we are here for you!  Take good care, Ivery Quale, MD

## 2023-03-06 LAB — BASIC METABOLIC PANEL
BUN/Creatinine Ratio: 13 (ref 12–28)
BUN: 12 mg/dL (ref 8–27)
CO2: 22 mmol/L (ref 20–29)
Calcium: 9.1 mg/dL (ref 8.7–10.3)
Chloride: 106 mmol/L (ref 96–106)
Creatinine, Ser: 0.93 mg/dL (ref 0.57–1.00)
Glucose: 99 mg/dL (ref 70–99)
Potassium: 4.1 mmol/L (ref 3.5–5.2)
Sodium: 142 mmol/L (ref 134–144)
eGFR: 68 mL/min/{1.73_m2} (ref >59)

## 2023-03-06 LAB — LIPID PANEL
Chol/HDL Ratio: 2.7 ratio (ref 0.0–4.4)
Cholesterol, Total: 167 mg/dL (ref 100–199)
HDL: 63 mg/dL (ref >39)
LDL Chol Calc (NIH): 82 mg/dL (ref 0–99)
Triglycerides: 129 mg/dL (ref 0–149)
VLDL Cholesterol Cal: 22 mg/dL (ref 5–40)

## 2023-03-06 NOTE — Assessment & Plan Note (Signed)
Patient's most recent lipid panel in Sept 2023 was unremarkable. - Repeat lipid panel today - Continue atorvastatin as previously prescribed

## 2023-03-06 NOTE — Assessment & Plan Note (Addendum)
Patient's blood pressure remains overall stable and reasonably close to or within goal range.  - Repeat BMP today  - Continue losartan-hydrochlorothiazide as previously prescribed

## 2023-03-13 ENCOUNTER — Other Ambulatory Visit: Payer: 59

## 2023-03-18 ENCOUNTER — Telehealth: Payer: Self-pay

## 2023-03-18 ENCOUNTER — Ambulatory Visit
Admission: RE | Admit: 2023-03-18 | Discharge: 2023-03-18 | Disposition: A | Discharge status: Home / Self Care | Payer: 59 | Source: Ambulatory Visit | Attending: Family Medicine | Admitting: Family Medicine

## 2023-03-18 DIAGNOSIS — M545 Low back pain, unspecified: Secondary | ICD-10-CM

## 2023-03-18 NOTE — Telephone Encounter (Signed)
Patient calls nurse line requesting results from recent OV.   She reports she is unable to access her mychart at this time.   Normal results discussed with patient and all questions answered.

## 2023-04-08 ENCOUNTER — Other Ambulatory Visit: Payer: Self-pay | Admitting: Family Medicine

## 2023-04-08 DIAGNOSIS — M79604 Pain in right leg: Secondary | ICD-10-CM

## 2023-04-08 NOTE — Progress Notes (Signed)
Called and spoke with patient regarding recent lumbar xray, as my comments were not yet viewed on MyChart. No concerns for fracture or other acute bony changes at this time. Placed another referral for physical therapy to help with back pain as patient was not able to connect with them previously. Also discussed incidental possible gallstone/kidney stone finding on xray. Patient to call clinic and make a clinic appt with PCP for follow up. Patient informed to seek more immediate care if she notices signs like fever, intense abdominal or back pain, or blood in the urine.

## 2023-08-21 ENCOUNTER — Other Ambulatory Visit: Payer: Self-pay | Admitting: Student

## 2023-08-21 DIAGNOSIS — I10 Essential (primary) hypertension: Secondary | ICD-10-CM

## 2023-08-26 ENCOUNTER — Encounter: Payer: 59 | Admitting: Student

## 2023-09-18 ENCOUNTER — Ambulatory Visit: Payer: 59 | Admitting: Student

## 2023-09-21 ENCOUNTER — Other Ambulatory Visit: Payer: Self-pay | Admitting: Student

## 2023-09-29 ENCOUNTER — Encounter: Payer: Self-pay | Admitting: Student

## 2023-09-29 ENCOUNTER — Ambulatory Visit (INDEPENDENT_AMBULATORY_CARE_PROVIDER_SITE_OTHER): Payer: 59 | Admitting: Student

## 2023-09-29 VITALS — BP 140/96 | HR 69 | Ht 67.0 in | Wt 216.6 lb

## 2023-09-29 DIAGNOSIS — Z23 Encounter for immunization: Secondary | ICD-10-CM | POA: Diagnosis not present

## 2023-09-29 DIAGNOSIS — M25551 Pain in right hip: Secondary | ICD-10-CM | POA: Insufficient documentation

## 2023-09-29 DIAGNOSIS — R202 Paresthesia of skin: Secondary | ICD-10-CM | POA: Diagnosis not present

## 2023-09-29 DIAGNOSIS — Z13820 Encounter for screening for osteoporosis: Secondary | ICD-10-CM

## 2023-09-29 DIAGNOSIS — R7303 Prediabetes: Secondary | ICD-10-CM | POA: Diagnosis not present

## 2023-09-29 DIAGNOSIS — I10 Essential (primary) hypertension: Secondary | ICD-10-CM

## 2023-09-29 DIAGNOSIS — Z0000 Encounter for general adult medical examination without abnormal findings: Secondary | ICD-10-CM

## 2023-09-29 MED ORDER — NAPROXEN 500 MG PO TABS: 500.0000 mg | ORAL_TABLET | Freq: Two times a day (BID) | ORAL | 0 refills | Status: AC

## 2023-09-29 NOTE — Progress Notes (Signed)
    SUBJECTIVE:   Chief compliant/HPI: annual examination  Alexandra White is a 68 y.o. who presents today for an annual exam.    History tabs reviewed and updated.   Review of systems form reviewed and notable for .   R hip/low back pain and paresthesias For past week. Woke up one day and it was hurting. Better with movement and worse when lying flat.  No recent repetitive movement or injury With tingling in R toes and R first, second and third fingers (worse at night) intermittently for past week.  lso notes numbness of in R great toe about a week ago but resolved Denies urinary/BM issues  OBJECTIVE:   BP (!) 140/96   Pulse 69   Ht 5\' 7"  (1.702 m)   Wt 216 lb 9.6 oz (98.2 kg)   LMP 09/19/2010   SpO2 100%   BMI 33.92 kg/m    General: Well-appearing 61 external female, NAD Cardio: RRR, normal S1/S2 Lungs: CTAB, normal effort Abdomen: Soft, nontender palpation, nondistended MSK: Right hip and right lower back nontender to palpation.  5/5 muscle strength of BLEs without pain.  Good ROM of left hip with internal, external rotation, flexion and extension without pain.  Straight leg test negative.  No swelling, deformities or erythema fingers. Neuro: Alert, no focal deficits Psych: Mood and affect appropriate  ASSESSMENT/PLAN:   Paresthesias Paresthesias in first, second and third fingers of the right hand most consistent with carpal tunnel syndrome Given she is also having some paresthesias in her toes, will rule out deficiencies and anemia. -B12 and folate -CBC -Advised to purchase wrist splint to wear at night and during the day as tolerated to reduce symptoms of carpal tunnel syndrome  Right hip pain Right hip/right lower back pain.   Given it is better with movement worse with rest, lower concern for OA. No red flags and is only been present for a week with no known injury. Could be arthritis vs sciatica vs muscle strain -Rx naproxen -Return in 2 weeks for  follow-up  HTN (hypertension) Very slightly high on repeat with diastolic of 96.  Did take Hyzaar last night -Continue Hyzaar -Can recheck when she returns in ~ 2 weeks    Annual Examination  See AVS for age appropriate recommendations  PHQ score 0, reviewed and discussed.  BP reviewed and at goal .  Feels safe in her relationship  Considered the following items based upon USPSTF recommendations: Diabetes screening: ordered Screening for elevated cholesterol:  lipid panel 90/2023, WNL HIV testing:  NR 2017 Hepatitis C: neg 2017 Syphilis if at high risk: discussed GC/CT not at high risk and not ordered. DEXA ordered.  Reviewed risk factors for latent tuberculosis and not indicated  Cervical cancer screening: aged out, previously nml paps Breast cancer screening:  due in September Colorectal cancer screening: up to date on screening for CRC. Vaccinations COVID administered.     Erick Alley, DO [facility]

## 2023-09-29 NOTE — Assessment & Plan Note (Signed)
 Right hip/right lower back pain.   Given it is better with movement worse with rest, lower concern for OA. No red flags and is only been present for a week with no known injury. Could be arthritis vs sciatica vs muscle strain -Rx naproxen -Return in 2 weeks for follow-up

## 2023-09-29 NOTE — Assessment & Plan Note (Signed)
 Very slightly high on repeat with diastolic of 96.  Did take Hyzaar last night -Continue Hyzaar -Can recheck when she returns in ~ 2 weeks

## 2023-09-29 NOTE — Assessment & Plan Note (Signed)
 Paresthesias in first, second and third fingers of the right hand most consistent with carpal tunnel syndrome Given she is also having some paresthesias in her toes, will rule out deficiencies and anemia. -B12 and folate -CBC -Advised to purchase wrist splint to wear at night and during the day as tolerated to reduce symptoms of carpal tunnel syndrome

## 2023-09-29 NOTE — Patient Instructions (Addendum)
 It was great to see you! Thank you for allowing me to participate in your care!  I recommend that you always bring your medications to each appointment as this makes it easy to ensure you are on the correct medications and helps [place] not miss when refills are needed.  Our plans for today:  - Call to schedule Dexa scan to check bone density: Address: [address], [place], [place] [address] Phone: [phone] - I think carpal tunnel syndrome is causes the tingling in your fingers. Purchase R wrist splint on [employer] and wear every night and during the day when tolerated  -schedule lab only visit for A1c and other labs - I sent in a prescription for naproxen. Take twice daily with a meal. If pain does not improve, return   Take care and seek immediate care sooner if you develop any concerns.   Dr. Erick Alley, DO [facility]

## 2023-09-30 ENCOUNTER — Other Ambulatory Visit

## 2023-09-30 DIAGNOSIS — R202 Paresthesia of skin: Secondary | ICD-10-CM

## 2023-09-30 NOTE — Addendum Note (Signed)
 Addended by: Howard Pouch on: 09/30/2023 01:16 PM   Modules accepted: Orders

## 2023-10-02 ENCOUNTER — Encounter: Payer: Self-pay | Admitting: Student

## 2023-10-02 ENCOUNTER — Other Ambulatory Visit: Payer: Self-pay | Admitting: Student

## 2023-10-02 DIAGNOSIS — R7989 Other specified abnormal findings of blood chemistry: Secondary | ICD-10-CM

## 2023-10-02 LAB — CBC
Hematocrit: 41.3 % (ref 34.0–46.6)
Hemoglobin: 12.8 g/dL (ref 11.1–15.9)
MCH: 27.6 pg (ref 26.6–33.0)
MCHC: 31 g/dL — ABNORMAL LOW (ref 31.5–35.7)
MCV: 89 fL (ref 79–97)
Platelets: 237 10*3/uL (ref 150–450)
RBC: 4.64 x10E6/uL (ref 3.77–5.28)
RDW: 13.6 % (ref 11.7–15.4)
WBC: 5.3 10*3/uL (ref 3.4–10.8)

## 2023-10-02 LAB — FOLATE: Folate: 12 ng/mL (ref >3.0)

## 2023-10-02 LAB — VITAMIN B12

## 2023-10-02 NOTE — Progress Notes (Signed)
 Called pt to discuss recent lab results showing B12 >2000 Pt states she took a B12 gummy last week which is likely the cause of elevation  Can recheck in future to ensure it comes down   Pt complains of pain behind R knee and request apt for tomorrow, apt made with Dr. Marisue Humble

## 2023-10-02 NOTE — Telephone Encounter (Signed)
 Patient calls nurse line in regards to results.   Results discussed with patient. She reports she has not had a B12 supplement of any kid in "well over" 6 months.   Patient advised additional lab work is needed.   She requests to have lab work drawn tomorrow, as she does not want to wait until PCP next apt on 3/31.  Will forward to PCP.

## 2023-10-03 ENCOUNTER — Ambulatory Visit: Admitting: Student

## 2023-10-03 ENCOUNTER — Ambulatory Visit
Admission: RE | Admit: 2023-10-03 | Discharge: 2023-10-03 | Disposition: A | Discharge status: Home / Self Care | Source: Ambulatory Visit | Attending: Family Medicine | Admitting: Family Medicine

## 2023-10-03 VITALS — BP 132/78 | HR 72 | Ht 67.0 in | Wt 211.8 lb

## 2023-10-03 DIAGNOSIS — M25551 Pain in right hip: Secondary | ICD-10-CM | POA: Diagnosis not present

## 2023-10-03 DIAGNOSIS — R350 Frequency of micturition: Secondary | ICD-10-CM | POA: Diagnosis not present

## 2023-10-03 LAB — POCT URINALYSIS DIPSTICK
Bilirubin, UA: NEGATIVE
Blood, UA: NEGATIVE
Glucose, UA: NEGATIVE
Nitrite, UA: NEGATIVE
Protein, UA: NEGATIVE
Spec Grav, UA: 1.02 (ref 1.010–1.025)
Urobilinogen, UA: 1 U/dL
pH, UA: 7 (ref 5.0–8.0)

## 2023-10-03 NOTE — Patient Instructions (Addendum)
 I have the highest suspicion that your hip pain is related to arthritis. Let's do two things: Get X Rays. Please go to [facility] at [employer] to get your X-Ray done. They are open 7:30a-5p Monday-Friday. You do not need an appointment to get this done.  The second thing I would like to do is send you to physical therapy. They will call you to set this up.   Eliezer Mccoy, MD

## 2023-10-03 NOTE — Progress Notes (Signed)
    SUBJECTIVE:   CHIEF COMPLAINT / HPI:   Right Hip / Groin Pain She experiences pain primarily in her right hip and back, which has been present for a while (since at least 2015) but worsened recently. The pain is described as being on and off, with a significant increase in severity over the past month. It radiates into her anterior thigh. No recent trauma or injury to the area. She has been prescribed Naprosyn for pain management.  Over the past two weeks, she reports increased nocturnal urination without any burning sensation. No daytime frequency. Thinks that the sleep disturbance may actually be due to her hip pain and not her bladder itself.   She joined [employer] earlier this week but hasn't actually gotten in to work out just yet.   OBJECTIVE:   BP 132/78   Pulse 72   Ht 5\' 7"  (1.702 m)   Wt 211 lb 12.8 oz (96.1 kg)   LMP 09/19/2010   SpO2 98%   BMI 33.17 kg/m   Gen: Well-appearing and NAD MSK: Right hip without obvious deformity. There is tenderness over the anterior groin. No lateral or posterior pain. SI joint is non-tender. Negative SLR. + FABER. - FADIR, log roll. Able to stand and walk.   ASSESSMENT/PLAN:   Assessment & Plan Pain of right hip Chronic right hip pain likely due to osteoarthritis with worsening symptoms.. Discussed potential for future hip replacement with favorable outcomes given her relatively young age and good health---she is hopeful to avoid this altogether.. Current focus on conservative management with physical therapy and pain management. - Order hip x-ray at [facility]. - Refer to physical therapy for hip osteoarthritis management. - Continue Naprosyn for pain management. - Consider referral to sports medicine for ultrasound-guided injection if physical therapy is ineffective. Urinary frequency Unusual to not have daytime symptoms. Suspect she may be right that this is more related to her hip and nighttime awakening as opposed to  infection. UA with trace leuks, otherwise non-infectious appearing. Do not think treatment is warranted at this time.     Eliezer Mccoy, MD [facility]

## 2023-10-06 ENCOUNTER — Encounter: Payer: Self-pay | Admitting: Student

## 2023-10-06 NOTE — Telephone Encounter (Signed)
 Patient calls nurse line regarding these results. Advised of message per Dr. Jena Gauss.   Answered all questions.   Veronda Prude, RN

## 2023-10-10 ENCOUNTER — Other Ambulatory Visit: Payer: Self-pay | Admitting: Student

## 2023-10-10 DIAGNOSIS — M25551 Pain in right hip: Secondary | ICD-10-CM

## 2023-10-14 ENCOUNTER — Other Ambulatory Visit: Payer: Self-pay | Admitting: Student

## 2023-10-14 DIAGNOSIS — M25551 Pain in right hip: Secondary | ICD-10-CM

## 2023-10-15 ENCOUNTER — Other Ambulatory Visit: Payer: Self-pay

## 2023-10-15 ENCOUNTER — Ambulatory Visit: Attending: Family Medicine

## 2023-10-15 DIAGNOSIS — M25551 Pain in right hip: Secondary | ICD-10-CM | POA: Diagnosis present

## 2023-10-15 DIAGNOSIS — M5459 Other low back pain: Secondary | ICD-10-CM | POA: Diagnosis present

## 2023-10-15 NOTE — Therapy (Signed)
 OUTPATIENT PHYSICAL THERAPY LOWER EXTREMITY EVALUATION   Patient Name: Alexandra White MRN: [id number] DOB:Jul 10, 1956, 68 y.o., female Today's Date: 10/15/2023  END OF SESSION:   Past Medical History:  Diagnosis Date   High cholesterol    HTN (hypertension) 09/20/2009   Qualifier: Diagnosis of  By: Alexandra Lim MD, Alexandra White     Past Surgical History:  Procedure Laterality Date   CESAREAN SECTION     MULTIPLE TOOTH EXTRACTIONS     Patient Active Problem List   Diagnosis Date Noted   Right hip pain 09/29/2023   Paresthesias 09/29/2023   Lumbar pain with radiation down right leg 03/05/2023   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   Pre-syncope 01/06/2020   Sinus bradycardia 01/06/2020   Insomnia 07/16/2019   Pre-diabetes 07/16/2019   Obesity 09/08/2012   Asthma 04/23/2010   HTN (hypertension) 09/20/2009   HLD (hyperlipidemia) 02/24/2008   Allergic rhinitis 02/24/2008    PCP: Alexandra Alley, DO  REFERRING PROVIDER:  McDiarmid, Alexandra Roach, MD    REFERRING DIAG: Pain of right hip [M25.551]   THERAPY DIAG:  No diagnosis found.  Rationale for Evaluation and Treatment: Rehabilitation  ONSET DATE: ***  SUBJECTIVE:   SUBJECTIVE STATEMENT: Patient reports to PT with tight pain in her right hip and groin. She states that its a nagging pain that she feels "whenever I get ready to rest." She states that she doesn't have any pain as long as she is "up and moving." She does report that she has trouble sleeping and often wakes up d/t pain. She recently began going to Alexandra White and is working on cardio, stretching.   PERTINENT HISTORY: ***   She also reports history of plantar fasciitis in both feet.   PAIN:  Are you having pain?  Yes: NPRS scale: *** Pain location: R hip  Pain description: tightness, numbness/tingling occasionally  Aggravating factors: laying down or sitting down at end of day Relieving factors: moving around  PRECAUTIONS: None  RED  FLAGS: None   WEIGHT BEARING RESTRICTIONS: No  FALLS:  Has patient fallen in last 6 months? 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  DIAGNOSTIC FINDINGS:   10/03/2023 Right Hip X-ray  FINDINGS: There is no evidence of hip fracture or dislocation. There is no evidence of arthropathy or other focal bone abnormality.   IMPRESSION: Negative.  PATIENT SURVEYS:  FOTO ***  COGNITION: Overall cognitive status: Within functional limits for tasks assessed     SENSATION: {sensation:27233}  EDEMA:  {edema:24020}  MUSCLE LENGTH: Hamstrings: Right *** deg; Left *** deg Maisie Fus test: Right *** deg; Left *** deg  POSTURE: {posture:25561}  PALPATION: ***  LOWER EXTREMITY ROM:  Active ROM Right eval Left eval  Hip flexion    Hip extension    Hip abduction    Hip adduction    Hip internal rotation    Hip external rotation    Knee flexion    Knee extension    Ankle dorsiflexion    Ankle plantarflexion    Ankle inversion    Ankle eversion     (Blank rows = not tested)  LOWER EXTREMITY MMT:  MMT Right eval Left eval  Hip flexion 4- 4-  Hip extension    Hip abduction 5 5  Hip adduction 4+ 4+  Hip internal rotation    Hip external rotation    Knee flexion 4 4+  Knee extension 4 4  Ankle dorsiflexion 5 5  Ankle plantarflexion    Ankle inversion    Ankle eversion     (Blank rows = not tested)  LOWER EXTREMITY SPECIAL TESTS:  {LEspecialtests:26242}  FUNCTIONAL TESTS:  {Functional tests:24029}  GAIT: Distance walked: *** Assistive device utilized: {Assistive devices:23999} Level of assistance: {Levels of  assistance:24026} Comments: ***                                                                                                                                TREATMENT DATE:   [facility] Adult PT Treatment:                                                DATE: 10/15/2023   Initial evaluation: see patient education and home exercise program as noted below      PATIENT EDUCATION:  Education details: reviewed initial home exercise program; discussion of POC, prognosis and goals for skilled PT   Person educated: Patient Education method: Explanation, Demonstration, and Handouts Education comprehension: verbalized understanding, returned demonstration, and needs further education  HOME EXERCISE PROGRAM: ***  ASSESSMENT:  CLINICAL IMPRESSION: [name] is a 68 y.o. female who was seen today for physical therapy evaluation and treatment for ***. She is demonstrating ***. She has related pain and difficulty with ***. She requires skilled PT services at this time to address relevant deficits and improve overall function.     OBJECTIVE IMPAIRMENTS: {opptimpairments:25111}.   ACTIVITY LIMITATIONS: {activitylimitations:27494}  PARTICIPATION LIMITATIONS: {participationrestrictions:25113}  PERSONAL FACTORS: {Personal factors:25162} are also affecting patient's functional outcome.   REHAB POTENTIAL: {rehabpotential:25112}  CLINICAL DECISION MAKING: {clinical decision making:25114}  EVALUATION COMPLEXITY: {Evaluation complexity:25115}   GOALS: Goals reviewed with patient? Yes  SHORT TERM GOALS = LONG TERM GOALS: Target date: 11/12/2023   *** Baseline:  Goal status: INITIAL  2.  *** Baseline:  Goal status: INITIAL  3.  *** Baseline:  Goal status: INITIAL    PLAN:  PT FREQUENCY: 1-2x/week  PT DURATION: 4 weeks  PLANNED INTERVENTIONS: {rehab planned interventions:25118::"97110-Therapeutic exercises","97530- Therapeutic [phone]- Neuromuscular re-education","97535- Self  FAOZ","30865- Manual therapy"}  PLAN FOR NEXT SESSION: Alexandra White, PT, DPT  10/15/2023 9:31 AM

## 2023-10-22 ENCOUNTER — Ambulatory Visit: Attending: Family Medicine | Admitting: Physical Therapy

## 2023-10-22 ENCOUNTER — Telehealth: Payer: Self-pay | Admitting: Physical Therapy

## 2023-10-22 DIAGNOSIS — M25551 Pain in right hip: Secondary | ICD-10-CM | POA: Insufficient documentation

## 2023-10-22 DIAGNOSIS — M5459 Other low back pain: Secondary | ICD-10-CM | POA: Insufficient documentation

## 2023-10-22 NOTE — Telephone Encounter (Signed)
 Called pt re: today's no show - LVM with date/time of next appt, office callback number, and reminder of attendance policy. Encouraged to call back w/ questions/concerns

## 2023-10-27 NOTE — Therapy (Addendum)
 OUTPATIENT PHYSICAL THERAPY TREATMENT + NO VISIT DISCHARGE SUMMARY (see below)    Patient Name: Alexandra White MRN: [id number] DOB:[birth year], 68 y.o., female Today's Date: 10/15/2023  END OF SESSION:  PT End of Session - 10/28/23 0958     Visit Number 2    Number of Visits 9    Date for PT Re-Evaluation 11/12/23    Authorization Type [employer] [facility]    Authorization Time Period auth submitted    PT Start Time 0959    PT Stop Time 1041    PT Time Calculation (min) 42 min    Activity Tolerance Patient tolerated treatment well              Past Medical History:  Diagnosis Date   High cholesterol    HTN (hypertension) 09/20/2009   Qualifier: Diagnosis of  By: Bari MD, Theodoro     Past Surgical History:  Procedure Laterality Date   CESAREAN SECTION     MULTIPLE TOOTH EXTRACTIONS     Patient Active Problem List   Diagnosis Date Noted   Right hip pain 09/29/2023   Paresthesias 09/29/2023   Lumbar pain with radiation down right leg 03/05/2023   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   Pre-syncope 01/06/2020   Sinus bradycardia 01/06/2020   Insomnia 07/16/2019   Pre-diabetes 07/16/2019   Obesity 09/08/2012   Asthma 04/23/2010   HTN (hypertension) 09/20/2009   HLD (hyperlipidemia) 02/24/2008   Allergic rhinitis 02/24/2008   PCP: Joshua Domino, DO  REFERRING PROVIDER:  McDiarmid, Krystal BIRCH, MD    REFERRING DIAG: Pain of right hip [M25.551]   THERAPY DIAG:  Pain in right hip  Other low back pain  Rationale for Evaluation and Treatment: Rehabilitation  ONSET DATE: 10/03/2023 date of referral   SUBJECTIVE:   Per eval - Patient reports to PT with tight pain in her right hip and groin. She states that its a nagging pain that she feels whenever I get ready to rest. She states that she doesn't have any pain as long as she is up and moving. She does report that she has trouble sleeping and often wakes up d/t pain. She recently began going to [employer] and  is working on cardio, stretching.   SUBJECTIVE STATEMENT 10/28/2023 Pt states she has been doing quite well. Feels like symptoms will flare up here and there, tend to fluctuate every few days. States she will sometimes get flared after working in the yard. Has been going to the gym 3-4x/week seems  like it helps it.    PERTINENT HISTORY: Relevant PMHx includes HTN, SIJ inflammation, sinus bradycardia, obesity. She also self-reports history of plantar fasciitis in both feet.   PAIN:  Are you having pain?  Yes: NPRS scale: 0/10 current  Pain location: R hip  Pain description: tightness, numbness/tingling occasionally  Aggravating factors: laying down or sitting down at end of day Relieving factors: moving around  PRECAUTIONS: None  RED FLAGS: None   WEIGHT BEARING RESTRICTIONS: No  FALLS:  Has patient fallen in last 6 months? 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   NEXT MD VISIT: not scheduled at time of eval   OBJECTIVE:  Note: Objective measures were completed at Evaluation unless otherwise noted.  DIAGNOSTIC FINDINGS:  10/03/2023 Right Hip X-ray  FINDINGS: There is no evidence of hip fracture or dislocation. There is no evidence of arthropathy or other focal bone abnormality.   IMPRESSION: Negative.  PATIENT SURVEYS:  LEFS 78/80  COGNITION: Overall cognitive status: Within functional limits for tasks assessed    MUSCLE LENGTH: Hamstrings: WFL bilaterally   PALPATION: Mild tenderness to palpation along right hip and right lumbar musculature  LOWER EXTREMITY ROM:  Active ROM Right eval Left eval  Hip flexion    Hip extension    Hip abduction    Hip adduction    Hip internal rotation     Hip external rotation    Knee flexion    Knee extension    Ankle dorsiflexion    Ankle plantarflexion    Ankle inversion    Ankle eversion     (Blank rows = not tested)  LOWER EXTREMITY MMT:  MMT Right eval Left eval  Hip flexion 4- 4-  Hip extension    Hip abduction 5 5  Hip adduction 4+ 4+  Hip internal rotation    Hip external rotation    Knee flexion 4 4+  Knee extension 4 4  Ankle dorsiflexion 5 5  Ankle plantarflexion    Ankle inversion    Ankle eversion     (Blank rows = not tested)  LOWER EXTREMITY SPECIAL TESTS:  Hip special tests: Belvie (FABER) test: negative  FUNCTIONAL TESTS:  Not performed at initial evaluation   GAIT: Distance walked: 50 feet from lobby to evaluation room  Assistive device utilized: None Level of assistance: Complete Independence Comments: no significant gait deviations noted at initial evaluation                                                                                                                                 TREATMENT DATE:  [facility] Adult PT Treatment:                                                DATE: 10/28/23  Neuromuscular re-ed: Paloff press green band x10 Bil cues for posture Lunges with counter support x10 BIL  cues for stability and positioning Quadruped fire hydrant x5 BIL emphasis on posture and motor control HEP update + education/handout w/ emphasis on safe/appropriate setup  Therapeutic Activity: Floor transfer practice w/ education/discussion re: appropriate mechanics and compensatory strategies as needed, CGA-SBA. ~ 10 reps total  Self Care: Education on pacing of activities, activity modification w/ respect to symptom behavior    [facility] Adult PT Treatment:  DATE: 10/15/2023   Initial evaluation: see patient education and home exercise program as noted below      PATIENT EDUCATION:  Education details: rationale for interventions, HEP  Person  educated: Patient Education method: Explanation, Demonstration, Tactile cues, Verbal cues Education comprehension: verbalized understanding, returned demonstration, verbal cues required, tactile cues required, and needs further education     HOME EXERCISE PROGRAM: Access Code: [email or web address] URL: [email or web address] Date: 10/28/2023 Prepared by: Alm Jenny  Exercises - Lunge with Counter Support  - 3-4 x weekly - 2-3 sets - 6-8 reps - Standing Anti-Rotation Press with Anchored Resistance  - 3-4 x weekly - 2-3 sets - 8 reps - Quadruped Fire Hydrant  - 3-4 x weekly - 2-3 sets - 5 reps  ASSESSMENT:  CLINICAL IMPRESSION: 10/28/2023 Pt arrives w/o pain, continues to endorse fluctuations in symptoms but overall doing well. She endorses difficulty w/ repetitive floor transfers and reaching from kneeling position while working in yard so we spend much of session working on safety and appropriate compensatory strategies to improve tolerance with this. Also introducing matched motor control/stability exercises to address core/hip/LE components of more functional movements. Reports muscle fatigue as expected but no increase in pain, demonstrates safe/appropriate performance with HEP. no adverse events. Recommend continuing along current POC in order to address relevant deficits and improve functional tolerance. Pt departs today's session in no acute distress, all voiced questions/concerns addressed appropriately from PT perspective.    Per eval - Shelsy is a 68 y.o. female who was seen today for physical therapy evaluation and treatment for Right hip pain with strength deficits. She has related pain with sleeping and laying down at the end of the day. She requires further assessment of functional mobility activities. She requires skilled PT services at this time to address relevant deficits and improve overall function.     OBJECTIVE IMPAIRMENTS: decreased strength and pain.   ACTIVITY  LIMITATIONS: sitting and sleeping  PARTICIPATION LIMITATIONS: interpersonal relationship and community activity  PERSONAL FACTORS: Past/current experiences and 1-2 comorbidities: Relevant PMHx includes HTN, SIJ inflammation, sinus bradycardia, obesity.  are also affecting patient's functional outcome.   REHAB POTENTIAL: Fair    CLINICAL DECISION MAKING: Stable/uncomplicated  EVALUATION COMPLEXITY: Low   GOALS: Goals reviewed with patient? Yes  SHORT TERM GOALS = LONG TERM GOALS: Target date: 11/12/2023   Patient will report ability to sleep greater than 6 hours at night Baseline: Patient reports that her sleep is limited to 4-5 hours d/t pain.  Goal status: INITIAL  2.  Patient will demonstrate at least 4+/5 MMT with lower quarter strength testing.  Baseline: see objective measures  Goal status: INITIAL  3.  Patient will demonstrate ability to perform floor to waist lifting of at least 15# using appropriate body mechanics and with no more than minimal pain in order to safely perform normal daily/occupational tasks.   Goal status: INITIAL    PLAN:  PT FREQUENCY: 1-2x/week  PT DURATION: 4 weeks  PLANNED INTERVENTIONS: 02835- PT Re-evaluation, 97110-Therapeutic exercises, 97530- Therapeutic activity, 97112- Neuromuscular re-education, 97535- Self Care, 02859- Manual therapy, [phone]- Aquatic Therapy, [id number]- Electrical stimulation (unattended), Patient/Family education, Taping, Dry Needling, Cryotherapy, and Moist heat  PLAN FOR NEXT SESSION: work on chair transfers and squats   Alm DELENA Jenny PT, DPT 10/28/2023 10:45 AM    Date of referral: 10/03/2023 Referring provider: McDiarmid, Krystal BIRCH, MD Referring diagnosis? Pain of right hip [M25.551]  Treatment diagnosis? (if different than referring diagnosis)  Pain in right hip Other low back  pain  What was this (referring dx) caused by? Ongoing Issue  Lysle of Condition: Chronic (continuous duration > 3  months)   Laterality: Rt  Current Functional Measure Score: LEFS 78/80  Objective measurements identify impairments when they are compared to normal values, the uninvolved extremity, and prior level of function.  []  Yes  []  No  Objective assessment of functional ability: Minimal functional limitations   Briefly describe symptoms: Right side hip and low back pain at night. Impaired sleep quality.   How did symptoms start: gradual onset   Average pain intensity:  Last 24 hours: 6/10  Past week: 6/10  How often does the pt experience symptoms? Occasionally  How much have the symptoms interfered with usual daily activities? A little bit  How has condition changed since care began at this facility? NA - initial visit  In general, how is the patients overall health? Good   BACK PAIN (STarT Back Screening Tool) No     Discharge addendum 01/12/2024  PHYSICAL THERAPY DISCHARGE SUMMARY  Visits from Start of Care: 2  Current functional level related to goals / functional outcomes: Unable to be assessed   Remaining deficits: Unable to be assessed   Education / Equipment: Unable to be assessed  Patient goals were unable to be assessed. Patient is being discharged due to not returning since the last visit.     Alm DELENA Jenny PT, DPT 01/12/2024 11:04 AM

## 2023-10-28 ENCOUNTER — Ambulatory Visit: Admitting: Physical Therapy

## 2023-10-28 ENCOUNTER — Encounter: Payer: Self-pay | Admitting: Physical Therapy

## 2023-10-28 DIAGNOSIS — M25551 Pain in right hip: Secondary | ICD-10-CM

## 2023-10-28 DIAGNOSIS — M5459 Other low back pain: Secondary | ICD-10-CM | POA: Diagnosis present

## 2023-10-30 ENCOUNTER — Ambulatory Visit: Admitting: Physical Therapy

## 2023-10-30 ENCOUNTER — Telehealth: Payer: Self-pay | Admitting: Physical Therapy

## 2023-10-30 NOTE — Telephone Encounter (Signed)
 Called re: this morning's missed appt - left voicemail advising on attendance policy w/ two missed appointments, confirmed date/time of next appt, and left office call back number for any questions/concerns or scheduling needs

## 2023-11-04 ENCOUNTER — Ambulatory Visit

## 2023-11-06 ENCOUNTER — Ambulatory Visit: Admitting: Physical Therapy

## 2023-11-11 ENCOUNTER — Encounter: Admitting: Physical Therapy

## 2023-11-13 ENCOUNTER — Encounter

## 2023-12-30 ENCOUNTER — Encounter: Payer: Self-pay | Admitting: *Deleted

## 2024-03-05 ENCOUNTER — Other Ambulatory Visit: Payer: Self-pay | Admitting: *Deleted

## 2024-03-05 DIAGNOSIS — I10 Essential (primary) hypertension: Secondary | ICD-10-CM

## 2024-03-05 MED ORDER — LOSARTAN POTASSIUM-HCTZ 50-12.5 MG PO TABS: 1.0000 | ORAL_TABLET | Freq: Every day | ORAL | 1 refills | Status: AC

## 2024-08-06 ENCOUNTER — Ambulatory Visit: Payer: Self-pay | Admitting: Family Medicine

## 2024-10-14 ENCOUNTER — Ambulatory Visit
